# Patient Record
Sex: Female | Born: 1975 | Race: Black or African American | Hispanic: No | Marital: Single | Smoking: Current every day smoker
Health system: Southern US, Community
[De-identification: ages and names within clinical notes are randomized; demographics above are authoritative.]

## PROBLEM LIST (undated history)

## (undated) DIAGNOSIS — R3915 Urgency of urination: Secondary | ICD-10-CM

## (undated) DIAGNOSIS — N390 Urinary tract infection, site not specified: Secondary | ICD-10-CM

## (undated) DIAGNOSIS — N3010 Interstitial cystitis (chronic) without hematuria: Secondary | ICD-10-CM

## (undated) DIAGNOSIS — R3989 Other symptoms and signs involving the genitourinary system: Secondary | ICD-10-CM

## (undated) DIAGNOSIS — E049 Nontoxic goiter, unspecified: Secondary | ICD-10-CM

## (undated) DIAGNOSIS — R351 Nocturia: Secondary | ICD-10-CM

## (undated) HISTORY — DX: Nontoxic goiter, unspecified: E04.9

## (undated) HISTORY — DX: Other symptoms and signs involving the genitourinary system: R39.89

## (undated) HISTORY — PX: CYSTOSCOPY WITH HYDRODISTENSION AND BIOPSY: SHX5127

## (undated) HISTORY — DX: Interstitial cystitis (chronic) without hematuria: N30.10

## (undated) HISTORY — DX: Urinary tract infection, site not specified: N39.0

## (undated) HISTORY — DX: Urgency of urination: R39.15

## (undated) HISTORY — DX: Nocturia: R35.1

---

## 2012-10-22 ENCOUNTER — Emergency Department: Payer: Self-pay | Admitting: Emergency Medicine

## 2012-10-22 LAB — URINALYSIS, COMPLETE
Bilirubin,UR: NEGATIVE
Blood: NEGATIVE
Ketone: NEGATIVE
Nitrite: NEGATIVE
Ph: 5 (ref 4.5–8.0)
Protein: NEGATIVE

## 2012-10-27 ENCOUNTER — Emergency Department: Payer: Self-pay | Admitting: Emergency Medicine

## 2012-10-27 LAB — URINALYSIS, COMPLETE
Leukocyte Esterase: NEGATIVE
Nitrite: POSITIVE
Ph: 5 (ref 4.5–8.0)
Protein: NEGATIVE
RBC,UR: 1 /HPF (ref 0–5)
Specific Gravity: 1.026 (ref 1.003–1.030)
WBC UR: 3 /HPF (ref 0–5)

## 2013-02-12 DIAGNOSIS — R351 Nocturia: Secondary | ICD-10-CM

## 2013-02-12 DIAGNOSIS — N390 Urinary tract infection, site not specified: Secondary | ICD-10-CM

## 2013-02-12 DIAGNOSIS — R3915 Urgency of urination: Secondary | ICD-10-CM

## 2013-02-12 HISTORY — DX: Urgency of urination: R39.15

## 2013-02-12 HISTORY — DX: Nocturia: R35.1

## 2013-02-12 HISTORY — DX: Urinary tract infection, site not specified: N39.0

## 2013-05-01 ENCOUNTER — Ambulatory Visit: Payer: Self-pay | Admitting: Urology

## 2013-12-16 DIAGNOSIS — R3989 Other symptoms and signs involving the genitourinary system: Secondary | ICD-10-CM

## 2013-12-16 DIAGNOSIS — N3010 Interstitial cystitis (chronic) without hematuria: Secondary | ICD-10-CM

## 2013-12-16 DIAGNOSIS — R350 Frequency of micturition: Secondary | ICD-10-CM | POA: Insufficient documentation

## 2013-12-16 HISTORY — DX: Interstitial cystitis (chronic) without hematuria: N30.10

## 2013-12-16 HISTORY — DX: Other symptoms and signs involving the genitourinary system: R39.89

## 2014-01-14 ENCOUNTER — Emergency Department: Payer: Self-pay | Admitting: Emergency Medicine

## 2014-05-31 NOTE — Op Note (Signed)
PATIENT NAME:  Maria Rush, Maria Rush MR#:  [id number] DATE OF BIRTH:  09-29-1975  DATE OF PROCEDURE:  05/01/2013   PREOPERATIVE DIAGNOSIS: Pelvic pain, frequency, urgency.   POSTOPERATIVE DIAGNOSIS:  Pelvic pain, frequency, urgency.  PROCEDURE: Cystoscopy with hydrodistention.   SURGEON: Irineo AxonScott Stoioff, M.D.   ASSISTANT: None.   ANESTHESIA: General.   INDICATIONS: This is a 39 year old female with a remote history of interstitial cystitis. She has had recent worsening of frequency, urgency, urge incontinence, and bladder pressure/pain. She has been on Toviaz with mild to moderate improvement in her symptoms. She presents for cystoscopy with hydrodistention.   DESCRIPTION OF PROCEDURE: The patient was taken to the cystoscopy suite and placed on the table in the supine position. A general anesthetic was administered via an LMA, and she was then placed in the low lithotomy position. Her external genitalia were prepped and draped in the usual fashion. A timeout was performed with all in agreement.   A 21-French cystoscope sheath with obturator was lubricated and passed per urethra. The bladder was emptied and cystoscopy was performed, which showed normal-appearing bladder mucosa without erythema or solid papillary lesions. The ureteral orifices were normal in position with clear efflux bilaterally. Under 80 cm water pressure, the bladder was filled to equilibrium. The bladder was drained and only 350 mL of saline was obtained. Repeat cystoscopy was performed, which showed multiple glomerulations in all areas of the bladder. The bladder was then distended in a similar fashion and kept distended for 5 minutes. Upon refill, the capacity was 350 mL with the most terminal portion of the irrigant being blood tinged. Repeat cystoscopy again shows predominant glomerulations without ulceration. A B and O suppository was placed per rectum. She was taken to PACU in stable condition.   There were no complications.  EBL was minimal.        ____________________________ Verna CzechScott C. Lonna CobbStoioff, MD scs:tc D: 05/02/2013 19:22:34 ET T: 05/02/2013 20:37:38 ET JOB#: [id number]  cc: Lorin PicketScott C. Lonna CobbStoioff, MD, <Dictator>    Riki AltesSCOTT C STOIOFF MD ELECTRONICALLY SIGNED 05/15/2013 13:52

## 2014-11-28 DIAGNOSIS — E049 Nontoxic goiter, unspecified: Secondary | ICD-10-CM

## 2014-11-28 HISTORY — DX: Nontoxic goiter, unspecified: E04.9

## 2014-12-11 ENCOUNTER — Other Ambulatory Visit: Payer: Self-pay | Admitting: Family Medicine

## 2014-12-11 DIAGNOSIS — E049 Nontoxic goiter, unspecified: Secondary | ICD-10-CM

## 2014-12-16 ENCOUNTER — Other Ambulatory Visit: Payer: Self-pay

## 2014-12-19 ENCOUNTER — Ambulatory Visit
Admission: RE | Admit: 2014-12-19 | Discharge: 2014-12-19 | Disposition: A | Discharge status: Home / Self Care | Payer: Medicaid Other | Source: Ambulatory Visit | Attending: Family Medicine | Admitting: Family Medicine

## 2014-12-19 DIAGNOSIS — E049 Nontoxic goiter, unspecified: Secondary | ICD-10-CM | POA: Diagnosis not present

## 2015-11-14 ENCOUNTER — Encounter: Payer: Self-pay | Admitting: Emergency Medicine

## 2015-11-14 ENCOUNTER — Emergency Department
Admission: EM | Admit: 2015-11-14 | Discharge: 2015-11-14 | Disposition: A | Discharge status: Home / Self Care | Payer: Medicaid Other | Attending: Emergency Medicine | Admitting: Emergency Medicine

## 2015-11-14 DIAGNOSIS — N3000 Acute cystitis without hematuria: Secondary | ICD-10-CM

## 2015-11-14 HISTORY — DX: Interstitial cystitis (chronic) without hematuria: N30.10

## 2015-11-14 LAB — URINALYSIS COMPLETE WITH MICROSCOPIC (ARMC ONLY)
Bacteria, UA: NONE SEEN
Bilirubin Urine: NEGATIVE
GLUCOSE, UA: NEGATIVE mg/dL
KETONES UR: NEGATIVE mg/dL
NITRITE: NEGATIVE
PH: 5 (ref 5.0–8.0)
Protein, ur: NEGATIVE mg/dL
Specific Gravity, Urine: 1.019 (ref 1.005–1.030)

## 2015-11-14 LAB — GLUCOSE, CAPILLARY: Glucose-Capillary: 109 mg/dL — ABNORMAL HIGH (ref 65–99)

## 2015-11-14 MED ORDER — CIPROFLOXACIN HCL 500 MG PO TABS: 500.0000 mg | ORAL_TABLET | Freq: Two times a day (BID) | ORAL | 0 refills | Status: DC

## 2015-11-14 NOTE — ED Triage Notes (Addendum)
Pt has had increased frequency over last 1-2 weeks. Notes urine has foul odor as well. Denies any abdominal or back pain. Frequent UTI r/t hx but never has had to be admitted.

## 2015-11-14 NOTE — ED Provider Notes (Signed)
[facility] Emergency Department Provider Note  ____________________________________________  Time seen: Approximately 1:15 PM  I have reviewed the triage vital signs and the nursing notes.   HISTORY  Chief Complaint Urinary Frequency    HPI Maria Rush is a 40 y.o. female who presents to the emergency department for evaluation of urinary frequency. She states that these symptoms are very similar to her previous urinary tract infections. She states that her urine has a file odor and she is having to urinate about every hour. Symptoms have progressively worsened over the past 1-2 weeks. She has not taken any medications. She denies back pain, nausea, or vomiting. She states that typically these symptoms occur and after taking Cipro for a couple of days symptoms resolved.  Past Medical History:  Diagnosis Date  . Interstitial cystitis     There are no active problems to display for this patient.   Past Surgical History:  Procedure Laterality Date  . CYSTOSCOPY WITH HYDRODISTENSION AND BIOPSY      Prior to Admission medications   Medication Sig Start Date End Date Taking? Authorizing Provider  ciprofloxacin (CIPRO) 500 MG tablet Take 1 tablet (500 mg total) by mouth 2 (two) times daily. 11/14/15   Chinita Pester, FNP    Allergies Iodinated diagnostic agents and Sulfa antibiotics  History reviewed. No pertinent family history.  Social History Social History  Substance Use Topics  . Smoking status: Never Smoker  . Smokeless tobacco: Never Used  . Alcohol use No    Review of Systems Constitutional: Negative for fever. Respiratory: Negative for shortness of breath or cough. Gastrointestinal: Negative for abdominal pain; negative for nausea , negative for vomiting. Genitourinary: Negative for dysuria , negative for vaginal discharge. Positive for urinary frequency and malodorous urine Musculoskeletal: Negative for back pain. Skin: Negative  for rash, will call or lesion.. ____________________________________________   PHYSICAL EXAM:  VITAL SIGNS: ED Triage Vitals  Enc Vitals Group     BP 11/14/15 1146 121/78     Pulse Rate 11/14/15 1145 100     Resp 11/14/15 1145 18     Temp 11/14/15 1145 98.2 F (36.8 C)     Temp Source 11/14/15 1145 Oral     SpO2 11/14/15 1145 99 %     Weight --      Height 11/14/15 1146 5' (1.524 m)     Head Circumference --      Peak Flow --      Pain Score 11/14/15 1206 6     Pain Loc --      Pain Edu? --      Excl. in GC? --     Constitutional: Alert and oriented. Well appearing and in no acute distress. Eyes: Conjunctivae are normal. EOMI. Head: Atraumatic. Nose: No congestion/rhinnorhea. Mouth/Throat: Mucous membranes are moist. Respiratory: Normal respiratory effort.  No retractions. Gastrointestinal: Abdomen soft, nontender, no rebound or guarding. Genitourinary: Pelvic exam: Not indicated Musculoskeletal: No extremity tenderness nor edema.  Neurologic:  Normal speech and language. No gross focal neurologic deficits are appreciated. Speech is normal. No gait instability. Skin:  Skin is warm, dry and intact. No rash noted. Psychiatric: Mood and affect are normal. Speech and behavior are normal.  ____________________________________________   LABS (all labs ordered are listed, but only abnormal results are displayed)  Labs Reviewed  URINALYSIS COMPLETEWITH MICROSCOPIC ([facility] ONLY) - Abnormal; Notable for the following:       Result Value   Color, Urine YELLOW (*)  APPearance CLEAR (*)    Hgb urine dipstick 1+ (*)    Leukocytes, UA 1+ (*)    Squamous Epithelial / LPF 0-5 (*)    All other components within normal limits  GLUCOSE, CAPILLARY - Abnormal; Notable for the following:    Glucose-Capillary 109 (*)    All other components within normal limits  CBG MONITORING, ED  POC URINE PREG, ED   ____________________________________________  RADIOLOGY  Not  indicated ____________________________________________   PROCEDURES  Procedure(s) performed: None  ____________________________________________   INITIAL IMPRESSION / ASSESSMENT AND PLAN / ED COURSE  Clinical Course     Pertinent labs & imaging results that were available during my care of the patient were reviewed by me and considered in my medical decision making (see chart for details).   Patient will be given prescription for Cipro today. She was advised to follow up with her primary care provider for symptoms that are not improving over the week. She was advised to return to the ER for symptoms that change or worsen if unable to schedule an appointment. ____________________________________________   FINAL CLINICAL IMPRESSION(S) / ED DIAGNOSES  Final diagnoses:  Acute cystitis without hematuria    Note:  This document was prepared using Dragon voice recognition software and may include unintentional dictation errors.    Chinita PesterCari B Ethyle Tiedt, FNP 11/14/15 1319    Governor Rooksebecca Lord, MD 11/14/15 [phone]

## 2015-11-14 NOTE — ED Notes (Signed)
Pt stating "I know I have a UTI." Pt stating that she has had several UTIs in the past so she is able to tell when she gets a UTI. Pt stating she has odor and frequency with urination.

## 2015-11-16 LAB — POCT PREGNANCY, URINE: Preg Test, Ur: NEGATIVE

## 2016-05-27 DIAGNOSIS — R399 Unspecified symptoms and signs involving the genitourinary system: Secondary | ICD-10-CM | POA: Insufficient documentation

## 2016-07-08 ENCOUNTER — Ambulatory Visit: Payer: Self-pay

## 2016-07-15 ENCOUNTER — Ambulatory Visit: Payer: Self-pay | Admitting: Urology

## 2016-07-15 ENCOUNTER — Telehealth: Payer: Self-pay | Admitting: Urology

## 2016-07-15 NOTE — Telephone Encounter (Signed)
patient was almost 30 minutes late for her last app on 07-15-16 it was Friday afternoon and she was our last patient. Dr. Sherryl BartersBudzyn left for the day and we were getting ready to leave and the patient showed up. she was upset because there was no one here to see her. I offered her an app for 07-18-16 but she said she could not get off of work. So I rescheduled her app. I apologized and she just asked  Why we didn't call  her to see where she was.?   Marcelino DusterMichelle

## 2016-08-23 ENCOUNTER — Encounter: Payer: Self-pay | Admitting: Urology

## 2016-08-23 ENCOUNTER — Ambulatory Visit (INDEPENDENT_AMBULATORY_CARE_PROVIDER_SITE_OTHER): Payer: Managed Care, Other (non HMO) | Admitting: Urology

## 2016-08-23 VITALS — BP 113/75 | HR 105 | Ht 60.0 in | Wt 161.0 lb

## 2016-08-23 DIAGNOSIS — N3010 Interstitial cystitis (chronic) without hematuria: Secondary | ICD-10-CM | POA: Insufficient documentation

## 2016-08-23 DIAGNOSIS — R350 Frequency of micturition: Secondary | ICD-10-CM | POA: Diagnosis not present

## 2016-08-23 LAB — URINALYSIS, COMPLETE
Bilirubin, UA: NEGATIVE
Glucose, UA: NEGATIVE
NITRITE UA: NEGATIVE
PH UA: 5.5 (ref 5.0–7.5)
Specific Gravity, UA: >1.03 — ABNORMAL HIGH (ref 1.005–1.030)
UUROB: 0.2 mg/dL (ref 0.2–1.0)

## 2016-08-23 LAB — MICROSCOPIC EXAMINATION: RBC MICROSCOPIC, UA: NONE SEEN /HPF (ref 0–?)

## 2016-08-23 MED ORDER — AMITRIPTYLINE HCL 25 MG PO TABS: 25.0000 mg | ORAL_TABLET | Freq: Every day | ORAL | 3 refills | Status: AC

## 2016-08-23 NOTE — Progress Notes (Signed)
urin

## 2016-08-23 NOTE — Progress Notes (Signed)
08/23/2016 11:44 AM   Westley ChandlerKimberly L Rush 1975/03/26 [id number]  Referring provider: Rayetta HumphreyGeorge, Sionne A, MD [address] [place] [address] [place], [place] [id number]  No chief complaint on file.   HPI: Consultation for lower urinary tract symptoms. She has symptoms of frequency, urgency and bladder pressure. She has a long history of interstitial cystitis. She notes that Dr. Garner Nashaniels diagnosed [place] in [place] approximately 20 years ago and she underwent hydraulic distention at that time and treatment with DMSO and she notes she did fairly well with this treatment stress in her family right now. She was laid off work from [employer] [place] and her daughter has gotten into some trouble and she is in very stressed out. She was subsequently seen by Dr. Virl DiamondStoiff in [place] and she underwent a second hydraulic bladder distention in 04/11/2013 with a capacity of 350 mL. She didn't think it helped her symptoms. She notes that she has tried some Atarax that has helped some. She has tried MidwifeToviaz, Personal assistantVESIcare and Myrbetriq and it helped slightly. She saw Dr. Earlene Plateravis and he started amitriptyline and Elmiron. She remains on amitriptyline 10 mg. Her main problem is she has severe urgency, frequency, nocturia q.3h over past few weeks. Stress really affects her bladder.  No dysuria or gross hematuria. Her UA today shows 11-30 wbc and moderate bacteria. I did send for urine cx.   Modifying factors: There are no other modifying factors  Associated signs and symptoms: There are no other associated signs and symptoms Aggravating and relieving factors: There are no other aggravating or relieving factors Severity: Moderate Duration: Persistent   PMH: Past Medical History:  Diagnosis Date  . Goiter 11/28/2014  . Interstitial cystitis     Surgical History: Past Surgical History:  Procedure Laterality Date  . CYSTOSCOPY WITH HYDRODISTENSION AND BIOPSY      Home Medications:  Allergies as of 08/23/2016      Reactions   Iodinated  Diagnostic Agents Swelling   Sulfa Antibiotics Rash      Medication List       Accurate as of 08/23/16 11:44 AM. Always use your most recent med list.          amitriptyline 10 MG tablet Commonly known as:  ELAVIL TAKE 1 TABLET (10 MG TOTAL) BY MOUTH NIGHTLY.       Allergies:  Allergies  Allergen Reactions  . Iodinated Diagnostic Agents Swelling  . Sulfa Antibiotics Rash    Family History: No family history on file.  Social History:  reports that she has never smoked. She has never used smokeless tobacco. She reports that she does not drink alcohol or use drugs.  ROS:                                        Physical Exam: BP 113/75   Pulse (!) 105   Ht 5' (1.524 m)   Wt 73 kg (161 lb)   LMP 08/01/2016 (Approximate)   BMI 31.44 kg/m   Constitutional:  Alert and oriented, No acute distress. HEENT: [place] AT, moist mucus membranes.  Trachea midline, no masses. Cardiovascular: No clubbing, cyanosis, or edema. Respiratory: Normal respiratory effort, no increased work of breathing. GI: Abdomen is soft, nontender, nondistended, no abdominal masses GU: No CVA tenderness. Skin: No rashes, bruises or suspicious lesions. Neurologic: Grossly intact, no focal deficits, moving all 4 extremities. Psychiatric: Normal mood and affect.  Laboratory Data:  No results found for: WBC, HGB, HCT, MCV, PLT  No results found for: CREATININE  No results found for: PSA  No results found for: TESTOSTERONE  No results found for: HGBA1C  Urinalysis    Component Value Date/Time   COLORURINE YELLOW (A) 11/14/2015 1157   APPEARANCEUR CLEAR (A) 11/14/2015 1157   APPEARANCEUR Clear 10/27/2012 1109   LABSPEC 1.019 11/14/2015 1157   LABSPEC 1.026 10/27/2012 1109   PHURINE 5.0 11/14/2015 1157   GLUCOSEU NEGATIVE 11/14/2015 1157   GLUCOSEU Negative 10/27/2012 1109   HGBUR 1+ (A) 11/14/2015 1157   BILIRUBINUR NEGATIVE 11/14/2015 1157   BILIRUBINUR Negative  10/27/2012 1109   KETONESUR NEGATIVE 11/14/2015 1157   PROTEINUR NEGATIVE 11/14/2015 1157   NITRITE NEGATIVE 11/14/2015 1157   LEUKOCYTESUR 1+ (A) 11/14/2015 1157   LEUKOCYTESUR Negative 10/27/2012 1109     Assessment & Plan:    1) frequency, urgency-we discussed dietary irritants. We discussed the role of PT/OT of the pelvic floor. We discussed trial of other anticholinergics as well as Myrbetriq. Other alternatives include increase amitriptyline or add hydroxyzine. Urine was sent for Cx. We'll increase amitriptyline to 25 mg po qhs and then can decrease when things settle down.    There are no diagnoses linked to this encounter.  No Follow-up on file.  Jerilee Field, MD  [facility] [address] [place], [place] [address] [phone]

## 2016-08-24 ENCOUNTER — Other Ambulatory Visit: Payer: Self-pay | Admitting: Otolaryngology

## 2016-08-24 ENCOUNTER — Ambulatory Visit: Payer: Managed Care, Other (non HMO)

## 2016-08-24 DIAGNOSIS — E041 Nontoxic single thyroid nodule: Secondary | ICD-10-CM

## 2016-08-27 LAB — CULTURE, URINE COMPREHENSIVE

## 2016-11-22 NOTE — Progress Notes (Deleted)
11/24/2016 9:08 PM   Maria Rush 01/31/1976 [id number]  Referring provider: Rayetta Humphrey, MD [address] [place], [place] [address]  No chief complaint on file.   HPI: 41 yo AAF with IC who presents for a 3 month follow up.  Background history Consultation for lower urinary tract symptoms. She has symptoms of frequency, urgency and bladder pressure. She has a long history of interstitial cystitis. She notes that Dr. Garner Nash diagnosed her in [place] approximately 20 years ago and she underwent hydraulic distention at that time and treatment with DMSO and she notes she did fairly well with this treatment stress in her family right now. She was laid off work from [employer] [place] and her daughter has gotten into some trouble and she is in very stressed out. She was subsequently seen by Dr. Virl Diamond in [place] and she underwent a second hydraulic bladder distention in 04/11/2013 with a capacity of 350 mL. She didn't think it helped her symptoms. She notes that she has tried some Atarax that has helped some. She has tried Midwife, Personal assistant and it helped slightly. She saw Dr. Earlene Plater and he started amitriptyline and Elmiron. She remains on amitriptyline 10 mg. Her main problem is she has severe urgency, frequency, nocturia q.3h over past few weeks. Stress really affects her bladder.  No dysuria or gross hematuria. Her UA today shows 11-30 wbc and moderate bacteria. I did send for urine cx.   Modifying factors: There are no other modifying factors  Associated signs and symptoms: There are no other associated signs and symptoms Aggravating and relieving factors: There are no other aggravating or relieving factors Severity: Moderate Duration: Persistent  Today, she has been experiencing urgency x *** (***), frequency x *** (***), not/is restricting fluids to avoid visits to the restroom ***, not/is engaging in toilet mapping, incontinence x *** (***) and nocturia x *** (***).   Her PVR is ***.     PMH: Past Medical History:  Diagnosis Date  . Bladder pain 12/16/2013  . Chronic interstitial cystitis 12/16/2013  . Goiter 11/28/2014  . Interstitial cystitis   . Nocturia 02/12/2013  . Recurrent UTI 02/12/2013  . Urinary urgency 02/12/2013    Surgical History: Past Surgical History:  Procedure Laterality Date  . CYSTOSCOPY WITH HYDRODISTENSION AND BIOPSY      Home Medications:  Allergies as of 11/24/2016      Reactions   Iodinated Diagnostic Agents Swelling   Sulfa Antibiotics Rash      Medication List       Accurate as of 11/22/16  9:08 PM. Always use your most recent med list.          amitriptyline 25 MG tablet Commonly known as:  ELAVIL Take 1 tablet (25 mg total) by mouth at bedtime.       Allergies:  Allergies  Allergen Reactions  . Iodinated Diagnostic Agents Swelling  . Sulfa Antibiotics Rash    Family History: No family history on file.  Social History:  reports that she has never smoked. She has never used smokeless tobacco. She reports that she does not drink alcohol or use drugs.  ROS:                                        Physical Exam: There were no vitals taken for this visit.  Constitutional:  Alert and oriented, No acute  distress. HEENT: [place] AT, moist mucus membranes.  Trachea midline, no masses. Cardiovascular: No clubbing, cyanosis, or edema. Respiratory: Normal respiratory effort, no increased work of breathing. GI: Abdomen is soft, nontender, nondistended, no abdominal masses GU: No CVA tenderness. Skin: No rashes, bruises or suspicious lesions. Neurologic: Grossly intact, no focal deficits, moving all 4 extremities. Psychiatric: Normal mood and affect.  Laboratory Data:  Urinalysis ***   Assessment & Plan:    1) frequency, urgency-we discussed dietary irritants. We discussed the role of PT/OT of the pelvic floor. We discussed trial of other anticholinergics as well as Myrbetriq.  Other alternatives include increase amitriptyline or add hydroxyzine. Urine was sent for Cx. We'll increase amitriptyline to 25 mg po qhs and then can decrease when things settle down.      No Follow-up on file.  Michiel Cowboy, PA-C  [facility] [address] [place], [place] [address] [phone]

## 2016-11-24 ENCOUNTER — Ambulatory Visit: Payer: Managed Care, Other (non HMO) | Admitting: Urology

## 2017-08-25 ENCOUNTER — Ambulatory Visit: Payer: Medicaid Other

## 2017-08-29 ENCOUNTER — Other Ambulatory Visit: Payer: Self-pay | Admitting: Otolaryngology

## 2017-08-29 DIAGNOSIS — E041 Nontoxic single thyroid nodule: Secondary | ICD-10-CM

## 2018-08-30 ENCOUNTER — Ambulatory Visit: Payer: Self-pay

## 2018-09-20 ENCOUNTER — Ambulatory Visit: Payer: Self-pay

## 2018-09-25 ENCOUNTER — Other Ambulatory Visit: Payer: Self-pay | Admitting: Otolaryngology

## 2018-09-25 DIAGNOSIS — E041 Nontoxic single thyroid nodule: Secondary | ICD-10-CM

## 2019-01-01 ENCOUNTER — Emergency Department
Admission: EM | Admit: 2019-01-01 | Discharge: 2019-01-01 | Disposition: A | Discharge status: Home / Self Care | Payer: Managed Care, Other (non HMO) | Attending: Emergency Medicine | Admitting: Emergency Medicine

## 2019-01-01 ENCOUNTER — Emergency Department: Payer: Managed Care, Other (non HMO)

## 2019-01-01 ENCOUNTER — Other Ambulatory Visit: Payer: Self-pay

## 2019-01-01 ENCOUNTER — Encounter: Payer: Self-pay | Admitting: *Deleted

## 2019-01-01 DIAGNOSIS — Z882 Allergy status to sulfonamides status: Secondary | ICD-10-CM | POA: Insufficient documentation

## 2019-01-01 DIAGNOSIS — Y999 Unspecified external cause status: Secondary | ICD-10-CM | POA: Insufficient documentation

## 2019-01-01 DIAGNOSIS — Z91041 Radiographic dye allergy status: Secondary | ICD-10-CM | POA: Insufficient documentation

## 2019-01-01 DIAGNOSIS — Y92411 Interstate highway as the place of occurrence of the external cause: Secondary | ICD-10-CM | POA: Diagnosis not present

## 2019-01-01 DIAGNOSIS — Z79899 Other long term (current) drug therapy: Secondary | ICD-10-CM | POA: Diagnosis not present

## 2019-01-01 DIAGNOSIS — Y9389 Activity, other specified: Secondary | ICD-10-CM | POA: Diagnosis not present

## 2019-01-01 DIAGNOSIS — S40022A Contusion of left upper arm, initial encounter: Secondary | ICD-10-CM | POA: Diagnosis not present

## 2019-01-01 DIAGNOSIS — S39012A Strain of muscle, fascia and tendon of lower back, initial encounter: Secondary | ICD-10-CM | POA: Diagnosis not present

## 2019-01-01 DIAGNOSIS — S40021A Contusion of right upper arm, initial encounter: Secondary | ICD-10-CM | POA: Insufficient documentation

## 2019-01-01 DIAGNOSIS — S4991XA Unspecified injury of right shoulder and upper arm, initial encounter: Secondary | ICD-10-CM | POA: Diagnosis present

## 2019-01-01 MED ORDER — MELOXICAM 7.5 MG PO TABS
15.0000 mg | ORAL_TABLET | Freq: Once | ORAL | Status: AC
  Administered 2019-01-01: 15 mg via ORAL
  Filled 2019-01-01: qty 2

## 2019-01-01 MED ORDER — METHOCARBAMOL 500 MG PO TABS: 500.0000 mg | ORAL_TABLET | Freq: Four times a day (QID) | ORAL | 0 refills | Status: DC

## 2019-01-01 MED ORDER — METHOCARBAMOL 500 MG PO TABS
1000.0000 mg | ORAL_TABLET | Freq: Once | ORAL | Status: AC
  Administered 2019-01-01: 1000 mg via ORAL
  Filled 2019-01-01: qty 2

## 2019-01-01 MED ORDER — MELOXICAM 15 MG PO TABS: 15.0000 mg | ORAL_TABLET | Freq: Every day | ORAL | 0 refills | Status: DC

## 2019-01-01 MED ORDER — LOPERAMIDE HCL 2 MG PO TABS: 2.0000 mg | ORAL_TABLET | Freq: Four times a day (QID) | ORAL | 0 refills | Status: DC | PRN

## 2019-01-01 MED ORDER — HYDROCODONE-ACETAMINOPHEN 5-325 MG PO TABS
1.0000 | ORAL_TABLET | Freq: Once | ORAL | Status: AC
  Administered 2019-01-01: 23:00:00 1 via ORAL
  Filled 2019-01-01: qty 1

## 2019-01-01 NOTE — ED Notes (Signed)
PT c/o back and bilateral arm pain. PT denies hitting head at all. Mostly arm pain.

## 2019-01-01 NOTE — ED Notes (Signed)
Pt signed physical discharge form. 
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
## 2019-02-04 ENCOUNTER — Emergency Department
Admission: EM | Admit: 2019-02-04 | Discharge: 2019-02-04 | Discharge status: Absent without leave | Payer: Managed Care, Other (non HMO)

## 2019-09-25 ENCOUNTER — Ambulatory Visit: Admission: RE | Admit: 2019-09-25 | Payer: Managed Care, Other (non HMO) | Source: Ambulatory Visit

## 2019-11-24 ENCOUNTER — Emergency Department: Payer: Managed Care, Other (non HMO)

## 2019-11-24 ENCOUNTER — Other Ambulatory Visit: Payer: Self-pay

## 2019-11-24 ENCOUNTER — Emergency Department
Admission: EM | Admit: 2019-11-24 | Discharge: 2019-11-24 | Disposition: A | Discharge status: Home / Self Care | Payer: Managed Care, Other (non HMO) | Attending: Emergency Medicine | Admitting: Emergency Medicine

## 2019-11-24 DIAGNOSIS — K5900 Constipation, unspecified: Secondary | ICD-10-CM | POA: Diagnosis not present

## 2019-11-24 MED ORDER — SORBITOL 70 % SOLN
960.0000 mL | TOPICAL_OIL | ORAL | Status: AC
  Administered 2019-11-24: 960 mL via RECTAL
  Filled 2019-11-24: qty 473

## 2019-11-24 MED ORDER — SORBITOL 70 % SOLN
960.0000 mL | TOPICAL_OIL | Freq: Once | ORAL | Status: DC
  Filled 2019-11-24: qty 473

## 2019-11-24 MED ORDER — DIBUCAINE (PERIANAL) 1 % EX OINT
TOPICAL_OINTMENT | Freq: Once | CUTANEOUS | Status: AC
  Filled 2019-11-24: qty 28

## 2019-11-24 NOTE — ED Triage Notes (Signed)
Pt comes POV with constipation since Tuesday. Pt had a small BM Tuesday but last normal BM was 1.5 weeks ago. Abdominal distension. Pt had a suppository this morning with no relief.

## 2019-11-24 NOTE — ED Provider Notes (Signed)
[facility] Emergency Department Provider Note  ____________________________________________   First MD Initiated Contact with Patient 11/24/19 1632     (approximate)  I have reviewed the triage vital signs and the nursing notes.   HISTORY  Chief Complaint Constipation    HPI Maria Rush is a 44 y.o. female presents emergency department complaining of constipation for 5 days.  Patient states she had a small BM on Tuesday but her last normal bowel movement was 1-1/2 weeks ago.  States she feels bloated and having pressure making her urinate more.  She tried any over-the-counter suppository earlier today without any success.  She has not tried any laxatives or stool softeners.    Past Medical History:  Diagnosis Date  . Bladder pain 12/16/2013  . Chronic interstitial cystitis 12/16/2013  . Goiter 11/28/2014  . Interstitial cystitis   . Nocturia 02/12/2013  . Recurrent UTI 02/12/2013  . Urinary urgency 02/12/2013    Patient Active Problem List   Diagnosis Date Noted  . Interstitial cystitis 08/23/2016  . UTI symptoms 05/27/2016  . Goiter 11/28/2014  . Bladder pain 12/16/2013  . Chronic interstitial cystitis 12/16/2013  . Increased frequency of urination 12/16/2013  . Nocturia 02/12/2013  . Recurrent UTI 02/12/2013  . Urinary urgency 02/12/2013    Past Surgical History:  Procedure Laterality Date  . CYSTOSCOPY WITH HYDRODISTENSION AND BIOPSY      Prior to Admission medications   Medication Sig Start Date End Date Taking? Authorizing Provider  amitriptyline (ELAVIL) 25 MG tablet Take 1 tablet (25 mg total) by mouth at bedtime. 08/23/16   Jerilee Field, MD  loperamide (IMODIUM A-D) 2 MG tablet Take 1 tablet (2 mg total) by mouth 4 (four) times daily as needed for diarrhea or loose stools. 01/01/19   Cuthriell, Delorise Royals, PA-C  meloxicam (MOBIC) 15 MG tablet Take 1 tablet (15 mg total) by mouth daily. 01/01/19   Cuthriell, Delorise Royals, PA-C    methocarbamol (ROBAXIN) 500 MG tablet Take 1 tablet (500 mg total) by mouth 4 (four) times daily. 01/01/19   Cuthriell, Delorise Royals, PA-C    Allergies Iodinated diagnostic agents and Sulfa antibiotics  History reviewed. No pertinent family history.  Social History Social History   Tobacco Use  . Smoking status: Never Smoker  . Smokeless tobacco: Never Used  Substance Use Topics  . Alcohol use: No  . Drug use: No    Review of Systems  Constitutional: No fever/chills Eyes: No visual changes. ENT: No sore throat. Respiratory: Denies cough Cardiovascular: Denies chest pain Gastrointestinal: Denies abdominal pain, positive constipation Genitourinary: Negative for dysuria. Musculoskeletal: Negative for back pain. Skin: Negative for rash. Psychiatric: no mood changes,     ____________________________________________   PHYSICAL EXAM:  VITAL SIGNS: ED Triage Vitals [11/24/19 1544]  Enc Vitals Group     BP (!) 162/93     Pulse Rate (!) 110     Resp 18     Temp 99.1 F (37.3 C)     Temp Source Oral     SpO2 99 %     Weight      Height      Head Circumference      Peak Flow      Pain Score 4     Pain Loc      Pain Edu?      Excl. in GC?     Constitutional: Alert and oriented. Well appearing and in no acute distress. Eyes: Conjunctivae are normal.  Head: Atraumatic. Nose: No congestion/rhinnorhea. Mouth/Throat: Mucous membranes are moist.   Neck:  supple no lymphadenopathy noted Cardiovascular: Normal rate, regular rhythm. Respiratory: Normal respiratory effort.  No retractions,  Abd: soft nontender bs normal all 4 quad GU: deferred Musculoskeletal: FROM all extremities, warm and well perfused Neurologic:  Normal speech and language.  Skin:  Skin is warm, dry and intact. No rash noted. Psychiatric: Mood and affect are normal. Speech and behavior are normal.  ____________________________________________   LABS (all labs ordered are listed, but only  abnormal results are displayed)  Labs Reviewed  POC URINE PREG, ED   ____________________________________________   ____________________________________________  RADIOLOGY  Abdomen 1 view shows a large stool ball, no obstruction Repeat abdomen one view shows decreasing stool burden ____________________________________________   PROCEDURES  Procedure(s) performed: Smog enema  Procedures    ____________________________________________   INITIAL IMPRESSION / ASSESSMENT AND PLAN / ED COURSE  Pertinent labs & imaging results that were available during my care of the patient were reviewed by me and considered in my medical decision making (see chart for details).   Patient is a 44 year old female presents with constipation.  Physical exam shows patient to appear little uncomfortable.  Abdomen is nontender. X-ray does show a large stool ball.  Plan of care at this time is to try a smog enema.  If the patient is able to pass some stool she will be able to be discharged home with additional laxatives and stool softeners.  We had a long discussion about using MiraLAX daily as this would help keep the stool soft and she would not be constipated in this manner.   Smog enema was successful.  Patient states she has a lot of abdominal cramping at this time.  Repeat abdomen one view does show decreased stool burden.  She was discharged in stable condition with instructions to follow-up with her regular doctor as needed.  Use over-the-counter MiraLAX daily.  Return if worsening.  ALAUNA Rush was evaluated in Emergency Department on 11/24/2019 for the symptoms described in the history of present illness. She was evaluated in the context of the global COVID-19 pandemic, which necessitated consideration that the patient might be at risk for infection with the SARS-CoV-2 virus that causes COVID-19. Institutional protocols and algorithms that pertain to the evaluation of patients at risk for  COVID-19 are in a state of rapid change based on information released by regulatory bodies including the CDC and federal and state organizations. These policies and algorithms were followed during the patient's care in the ED.    As part of my medical decision making, I reviewed the following data within the electronic MEDICAL RECORD NUMBER Nursing notes reviewed and incorporated, Old chart reviewed, Radiograph reviewed , Notes from prior ED visits and [place] Controlled Substance Database  ____________________________________________   FINAL CLINICAL IMPRESSION(S) / ED DIAGNOSES  Final diagnoses:  Acute constipation      NEW MEDICATIONS STARTED DURING THIS VISIT:  New Prescriptions   No medications on file     Note:  This document was prepared using Dragon voice recognition software and may include unintentional dictation errors.    Faythe Ghee, PA-C 11/24/19 2153    Dionne Bucy, MD 11/24/19 2352

## 2019-11-24 NOTE — ED Notes (Signed)
Pt states she had a decent bowel movement, just having abdominal cramping. Provider notified.

## 2019-11-24 NOTE — Discharge Instructions (Addendum)
Take over-the-counter MiraLAX daily, 1 tablespoon in a clear liquid per day.  This will keep the stool soft so you will not become constipated Return to emergency department if worsening

## 2019-12-14 ENCOUNTER — Encounter: Payer: Self-pay | Admitting: Emergency Medicine

## 2019-12-14 ENCOUNTER — Emergency Department: Payer: Commercial Managed Care - PPO

## 2019-12-14 ENCOUNTER — Other Ambulatory Visit: Payer: Self-pay

## 2019-12-14 ENCOUNTER — Emergency Department
Admission: EM | Admit: 2019-12-14 | Discharge: 2019-12-14 | Disposition: A | Discharge status: Home / Self Care | Payer: Commercial Managed Care - PPO | Attending: Emergency Medicine | Admitting: Emergency Medicine

## 2019-12-14 DIAGNOSIS — B372 Candidiasis of skin and nail: Secondary | ICD-10-CM | POA: Insufficient documentation

## 2019-12-14 DIAGNOSIS — B9689 Other specified bacterial agents as the cause of diseases classified elsewhere: Secondary | ICD-10-CM | POA: Diagnosis not present

## 2019-12-14 DIAGNOSIS — K5900 Constipation, unspecified: Secondary | ICD-10-CM | POA: Diagnosis not present

## 2019-12-14 LAB — POC URINE PREG, ED: Preg Test, Ur: NEGATIVE

## 2019-12-14 MED ORDER — DOCUSATE SODIUM 50 MG/5ML PO LIQD
100.0000 mg | Freq: Once | ORAL | Status: AC
  Administered 2019-12-14: 100 mg via ORAL
  Filled 2019-12-14: qty 10

## 2019-12-14 MED ORDER — DIBUCAINE (PERIANAL) 1 % EX OINT
TOPICAL_OINTMENT | Freq: Once | CUTANEOUS | Status: AC
  Filled 2019-12-14: qty 28

## 2019-12-14 MED ORDER — GLYCERIN (LAXATIVE) 2.1 G RE SUPP
1.0000 | Freq: Once | RECTAL | Status: AC
  Administered 2019-12-14: 1 via RECTAL
  Filled 2019-12-14: qty 1

## 2019-12-14 MED ORDER — CLOTRIMAZOLE 1 % EX CREA: 1.0000 "application " | TOPICAL_CREAM | Freq: Two times a day (BID) | CUTANEOUS | 0 refills | Status: AC

## 2019-12-14 MED ORDER — MAGNESIUM CITRATE PO SOLN
1.0000 | Freq: Once | ORAL | Status: AC
  Administered 2019-12-14: 1 via ORAL
  Filled 2019-12-14: qty 296

## 2019-12-14 NOTE — ED Notes (Signed)
Pharmacy called to send meds.

## 2019-12-14 NOTE — ED Triage Notes (Signed)
Pt reports is constipated. Pt states has been seen here before for the same. Last moved bowels a little bit on Monday.

## 2019-12-14 NOTE — Discharge Instructions (Addendum)
Follow-up with your primary care provider if any continued problems or concerns.  Also a cream was sent to your pharmacy to begin using under your right breast.  Allow this area to get plenty of air whenever possible to help dry up the rash.  If not improving you will need to follow-up with your primary care provider.  Also read information about constipation.  Increase fiber in your diet and continue with MiraLAX 1 capful each morning with any beverage of your choice followed by 8 ounces of water.  Also continue with 6 to 8 glasses of water during the day with this.

## 2019-12-14 NOTE — ED Notes (Signed)
Commode in room, pt states she has not had bowel movement from meds already given. Pt states small amount of liquid stool when she wipes

## 2019-12-14 NOTE — ED Provider Notes (Signed)
[facility] Emergency Department Provider Note   ____________________________________________   First MD Initiated Contact with Patient 12/14/19 1041     (approximate)  I have reviewed the triage vital signs and the nursing notes.   HISTORY  Chief Complaint Constipation   HPI Maria Rush is a 44 y.o. female presents to the ED with complaint of constipation.  Patient was seen on 11/24/2019 for constipation at which time she was given an enema and discharged.  She was seen again on 12/10/19 for complaint of constipation at her primary care provider who advised her to begin using MiraLAX and Dulcolax tablets.  Patient states that she is taking a tablespoon of MiraLAX daily without any relief of her constipation.  She denies any fever, chills, vomiting.  Patient has had gas a couple times.  Patient has not tried any enemas or suppositories at home.  Rates her pain as 5 out of 10.      Past Medical History:  Diagnosis Date  . Bladder pain 12/16/2013  . Chronic interstitial cystitis 12/16/2013  . Goiter 11/28/2014  . Interstitial cystitis   . Nocturia 02/12/2013  . Recurrent UTI 02/12/2013  . Urinary urgency 02/12/2013    Patient Active Problem List   Diagnosis Date Noted  . Interstitial cystitis 08/23/2016  . UTI symptoms 05/27/2016  . Goiter 11/28/2014  . Bladder pain 12/16/2013  . Chronic interstitial cystitis 12/16/2013  . Increased frequency of urination 12/16/2013  . Nocturia 02/12/2013  . Recurrent UTI 02/12/2013  . Urinary urgency 02/12/2013    Past Surgical History:  Procedure Laterality Date  . CYSTOSCOPY WITH HYDRODISTENSION AND BIOPSY      Prior to Admission medications   Medication Sig Start Date End Date Taking? Authorizing Provider  amitriptyline (ELAVIL) 25 MG tablet Take 1 tablet (25 mg total) by mouth at bedtime. 08/23/16   Jerilee Field, MD  clotrimazole (LOTRIMIN) 1 % cream Apply 1 application topically 2 (two) times daily.  12/14/19   Tommi Rumps, PA-C    Allergies Iodinated diagnostic agents, Silicone, and Sulfa antibiotics  No family history on file.  Social History Social History   Tobacco Use  . Smoking status: Never Smoker  . Smokeless tobacco: Never Used  Substance Use Topics  . Alcohol use: No  . Drug use: No    Review of Systems Constitutional: No fever/chills Eyes: No visual changes. Cardiovascular: Denies chest pain. Respiratory: Denies shortness of breath. Gastrointestinal: No abdominal pain.  No nausea, no vomiting.  No diarrhea.  Positive constipation. Genitourinary: Negative for dysuria. Musculoskeletal: Negative for back pain. Skin: Positive for rash. Neurological: Negative for headaches, focal weakness or numbness. ____________________________________________   PHYSICAL EXAM:  VITAL SIGNS: ED Triage Vitals  Enc Vitals Group     BP 12/14/19 1009 140/88     Pulse Rate 12/14/19 1009 82     Resp 12/14/19 1009 20     Temp 12/14/19 1009 99.1 F (37.3 C)     Temp Source 12/14/19 1009 Oral     SpO2 12/14/19 1009 100 %     Weight 12/14/19 1007 167 lb (75.8 kg)     Height 12/14/19 1007 4\' 11"  (1.499 m)     Head Circumference --      Peak Flow --      Pain Score 12/14/19 1006 5     Pain Loc --      Pain Edu? --      Excl. in GC? --  Constitutional: Alert and oriented. Well appearing and in no acute distress. Eyes: Conjunctivae are normal.  Head: Atraumatic. Neck: No stridor.   Cardiovascular: Normal rate, regular rhythm. Grossly normal heart sounds.  Good peripheral circulation. Respiratory: Normal respiratory effort.  No retractions. Lungs CTAB. Gastrointestinal: Soft and nontender. No distention.  Bowel sounds are present throughout. Musculoskeletal: No lower extremity tenderness nor edema.  No joint effusions. Neurologic:  Normal speech and language. No gross focal neurologic deficits are appreciated. No gait instability. Skin:  Skin is warm, dry and intact.   There is a erythematous shiny macular rash under the right breast with discrete margins.  No vesicles or open skin noted. Psychiatric: Mood and affect are normal. Speech and behavior are normal.  ____________________________________________   LABS (all labs ordered are listed, but only abnormal results are displayed)  Labs Reviewed  POC URINE PREG, ED   RADIOLOGY I, Tommi Rumps, personally viewed and evaluated these images (plain radiographs) as part of my medical decision making, as well as reviewing the written report by the radiologist.   Official radiology report(s): DG Abdomen 1 View  Result Date: 12/14/2019 CLINICAL DATA:  Constipation for 3 weeks EXAM: ABDOMEN - 1 VIEW COMPARISON:  11/24/2019 abdominal radiographs FINDINGS: No dilated small bowel loops. Large colorectal stool volume, most prominent in the rectum. No evidence of pneumatosis or pneumoperitoneum. No radiopaque nephrolithiasis. IMPRESSION: Large colorectal stool volume, most prominent in the rectum, compatible with reported constipation. No evidence of bowel obstruction. Electronically Signed   By: Delbert Phenix M.D.   On: 12/14/2019 11:56    ____________________________________________   PROCEDURES  Procedure(s) performed (including Critical Care):  Procedures   ____________________________________________   INITIAL IMPRESSION / ASSESSMENT AND PLAN / ED COURSE  As part of my medical decision making, I reviewed the following data within the electronic MEDICAL RECORD NUMBER Notes from prior ED visits and [place] Controlled Substance Database  44 year old female presents to the ED with history of constipation for 1 week.  Patient was also here last month for the same complaint.  Patient was seen by her PCP this week and was told to use MiraLAX which she has used 1 tablespoon daily without any results.  Patient reports that she has been passing some gas.  X-ray is negative for an obstruction.  After mag citrate mixed  with Colace and a soapsuds enema patient was able to have a large bowel movement and is now feeling much better.  She mentioned a rash that was looked at and this appears to be more fungal/yeast in nature under her right breast.  Patient was given a prescription for Lotrimin cream to apply to area twice daily and to allow this area to get lots of air.  She is to follow-up with her PCP if any continued problems.  ____________________________________________   FINAL CLINICAL IMPRESSION(S) / ED DIAGNOSES  Final diagnoses:  Constipation, unspecified constipation type  Yeast infection of the skin     ED Discharge Orders         Ordered    clotrimazole (LOTRIMIN) 1 % cream  2 times daily        12/14/19 1626          *Please note:  Maria Rush was evaluated in Emergency Department on 12/14/2019 for the symptoms described in the history of present illness. She was evaluated in the context of the global COVID-19 pandemic, which necessitated consideration that the patient might be at risk for infection with the SARS-CoV-2 virus  that causes COVID-19. Institutional protocols and algorithms that pertain to the evaluation of patients at risk for COVID-19 are in a state of rapid change based on information released by regulatory bodies including the CDC and federal and state organizations. These policies and algorithms were followed during the patient's care in the ED.  Some ED evaluations and interventions may be delayed as a result of limited staffing during and the pandemic.*   Note:  This document was prepared using Dragon voice recognition software and may include unintentional dictation errors.    Tommi Rumps, PA-C 12/14/19 1634    Gilles Chiquito, MD 12/14/19 1731

## 2020-02-07 ENCOUNTER — Other Ambulatory Visit: Payer: Self-pay

## 2020-02-07 ENCOUNTER — Ambulatory Visit
Admission: EM | Admit: 2020-02-07 | Discharge: 2020-02-07 | Disposition: A | Discharge status: Home / Self Care | Payer: Commercial Managed Care - PPO | Attending: Family Medicine | Admitting: Family Medicine

## 2020-02-07 DIAGNOSIS — R350 Frequency of micturition: Secondary | ICD-10-CM | POA: Diagnosis present

## 2020-02-07 DIAGNOSIS — B373 Candidiasis of vulva and vagina: Secondary | ICD-10-CM | POA: Diagnosis not present

## 2020-02-07 DIAGNOSIS — B3731 Acute candidiasis of vulva and vagina: Secondary | ICD-10-CM

## 2020-02-07 LAB — URINALYSIS, COMPLETE (UACMP) WITH MICROSCOPIC
Glucose, UA: NEGATIVE mg/dL
Ketones, ur: NEGATIVE mg/dL
Nitrite: NEGATIVE
Protein, ur: 100 mg/dL — AB
RBC / HPF: >50 RBC/hpf (ref 0–5)
Specific Gravity, Urine: >1.03 — ABNORMAL HIGH (ref 1.005–1.030)
pH: 5.5 (ref 5.0–8.0)

## 2020-02-07 MED ORDER — NITROFURANTOIN MONOHYD MACRO 100 MG PO CAPS: 100.0000 mg | ORAL_CAPSULE | Freq: Two times a day (BID) | ORAL | 0 refills | Status: DC

## 2020-02-07 MED ORDER — FLUCONAZOLE 200 MG PO TABS: 200.0000 mg | ORAL_TABLET | Freq: Every day | ORAL | 0 refills | Status: AC

## 2020-02-07 NOTE — Discharge Instructions (Addendum)
Take the Macrobid twice daily for 5 days for the UTI.  Take 1 Diflucan tablet now and repeat in 1 week if you are still having symptoms.  Increase your oral water intake so that you increase your urine production and flush your urinary system.  Return for reevaluation if develop any new or worsening symptoms.

## 2020-02-07 NOTE — ED Triage Notes (Signed)
Pt is here with the frequency to urinate since Sunday, pt has not taken any meds to relieve discomfort.

## 2020-02-07 NOTE — ED Provider Notes (Signed)
[facility]    CSN: [id number] Arrival date & time: 02/07/20  0944      History   Chief Complaint Chief Complaint  Patient presents with  . Urinary Tract Infection    HPI Maria Rush is a 44 y.o. female.   HPI   44 year old female here for evaluation of urinary frequency and urgency.  Patient reports that she has had the symptoms for the past 5 days.  Patient states that she has seen blood in her urine but she is also on her menses right now.  Patient denies fever, pain with urination, cloudiness to her urine, back pain, vaginal discharge or itching, nausea or vomiting, or abdominal pain.  Past Medical History:  Diagnosis Date  . Bladder pain 12/16/2013  . Chronic interstitial cystitis 12/16/2013  . Goiter 11/28/2014  . Interstitial cystitis   . Nocturia 02/12/2013  . Recurrent UTI 02/12/2013  . Urinary urgency 02/12/2013    Patient Active Problem List   Diagnosis Date Noted  . Interstitial cystitis 08/23/2016  . UTI symptoms 05/27/2016  . Goiter 11/28/2014  . Bladder pain 12/16/2013  . Chronic interstitial cystitis 12/16/2013  . Increased frequency of urination 12/16/2013  . Nocturia 02/12/2013  . Recurrent UTI 02/12/2013  . Urinary urgency 02/12/2013    Past Surgical History:  Procedure Laterality Date  . CYSTOSCOPY WITH HYDRODISTENSION AND BIOPSY      OB History   No obstetric history on file.      Home Medications    Prior to Admission medications   Medication Sig Start Date End Date Taking? Authorizing Provider  fluconazole (DIFLUCAN) 200 MG tablet Take 1 tablet (200 mg total) by mouth daily for 2 days. Take 1 tablet now and repeat in 1 week if your symptoms still are present. 02/07/20 02/09/20 Yes Becky Augusta, NP  nitrofurantoin, macrocrystal-monohydrate, (MACROBID) 100 MG capsule Take 1 capsule (100 mg total) by mouth 2 (two) times daily. 02/07/20  Yes Becky Augusta, NP  amitriptyline (ELAVIL) 10 MG tablet Take by mouth. 12/23/19    [provider]  amitriptyline (ELAVIL) 25 MG tablet Take 1 tablet (25 mg total) by mouth at bedtime. 08/23/16   Jerilee Field, MD  clotrimazole (LOTRIMIN) 1 % cream Apply 1 application topically 2 (two) times daily. 12/14/19   Tommi Rumps, PA-C    Family History Family History  Problem Relation Age of Onset  . Breast cancer Mother     Social History Social History   Tobacco Use  . Smoking status: Never Smoker  . Smokeless tobacco: Never Used  Substance Use Topics  . Alcohol use: Yes  . Drug use: No     Allergies   Iodinated diagnostic agents, Silicone, and Sulfa antibiotics   Review of Systems Review of Systems  Constitutional: Negative for activity change, appetite change and fever.  Gastrointestinal: Negative for abdominal pain, nausea and vomiting.  Genitourinary: Positive for frequency, hematuria, urgency and vaginal bleeding. Negative for dysuria, vaginal discharge and vaginal pain.  Musculoskeletal: Negative for back pain.  Skin: Negative for rash.  Hematological: Negative.   Psychiatric/Behavioral: Negative.      Physical Exam Triage Vital Signs ED Triage Vitals  Enc Vitals Group     BP 02/07/20 1134 120/90     Pulse Rate 02/07/20 1134 82     Resp 02/07/20 1134 18     Temp 02/07/20 1134 98.1 F (36.7 C)     Temp Source 02/07/20 1134 Oral     SpO2 02/07/20 1134  100 %     Weight --      Height --      Head Circumference --      Peak Flow --      Pain Score 02/07/20 1132 0     Pain Loc --      Pain Edu? --      Excl. in GC? --    No data found.  Updated Vital Signs BP 120/90 (BP Location: Left Arm)   Pulse 82   Temp 98.1 F (36.7 C) (Oral)   Resp 18   LMP 02/02/2020   SpO2 100%   Visual Acuity Right Eye Distance:   Left Eye Distance:   Bilateral Distance:    Right Eye Near:   Left Eye Near:    Bilateral Near:     Physical Exam Vitals and nursing note reviewed.  Constitutional:      General: She is not in acute  distress.    Appearance: Normal appearance. She is not toxic-appearing.  HENT:     Head: Normocephalic and atraumatic.  Cardiovascular:     Rate and Rhythm: Normal rate and regular rhythm.     Pulses: Normal pulses.     Heart sounds: No murmur heard. No gallop.   Pulmonary:     Effort: Pulmonary effort is normal.     Breath sounds: Normal breath sounds. No wheezing, rhonchi or rales.  Abdominal:     Tenderness: There is no right CVA tenderness or left CVA tenderness.  Skin:    General: Skin is warm and dry.     Capillary Refill: Capillary refill takes less than 2 seconds.     Findings: No erythema or rash.  Neurological:     General: No focal deficit present.     Mental Status: She is alert and oriented to person, place, and time.  Psychiatric:        Mood and Affect: Mood normal.        Behavior: Behavior normal.        Thought Content: Thought content normal.        Judgment: Judgment normal.      UC Treatments / Results  Labs (all labs ordered are listed, but only abnormal results are displayed) Labs Reviewed  URINALYSIS, COMPLETE (UACMP) WITH MICROSCOPIC - Abnormal; Notable for the following components:      Result Value   APPearance CLOUDY (*)    Specific Gravity, Urine >1.030 (*)    Hgb urine dipstick LARGE (*)    Bilirubin Urine SMALL (*)    Protein, ur 100 (*)    Leukocytes,Ua SMALL (*)    Bacteria, UA MANY (*)    All other components within normal limits  URINE CULTURE    EKG   Radiology No results found.  Procedures Procedures (including critical care time)  Medications Ordered in UC Medications - No data to display  Initial Impression / Assessment and Plan / UC Course  I have reviewed the triage vital signs and the nursing notes.  Pertinent labs & imaging results that were available during my care of the patient were reviewed by me and considered in my medical decision making (see chart for details).   Patient is here for evaluation of  urinary urgency and frequency that been going on for last 5 days.  She denies pain with urination.  She is nontoxic in appearance.  Will send urinalysis for evaluation of the lab.  Urinalysis shows large blood, 100 protein, small leukocytes, 6-10  white cells, greater than 50 RBCs, many bacteria, and budding yeast.  Will send urine for culture.  Will treat for UTI with Macrobid twice daily x5 days and Diflucan for yeast.   Final Clinical Impressions(s) / UC Diagnoses   Final diagnoses:  Urinary frequency  Vaginal candidiasis     Discharge Instructions     Take the Macrobid twice daily for 5 days for the UTI.  Take 1 Diflucan tablet now and repeat in 1 week if you are still having symptoms.  Increase your oral water intake so that you increase your urine production and flush your urinary system.  Return for reevaluation if develop any new or worsening symptoms.    ED Prescriptions    Medication Sig Dispense Auth. Provider   nitrofurantoin, macrocrystal-monohydrate, (MACROBID) 100 MG capsule Take 1 capsule (100 mg total) by mouth 2 (two) times daily. 10 capsule Becky Augusta, NP   fluconazole (DIFLUCAN) 200 MG tablet Take 1 tablet (200 mg total) by mouth daily for 2 days. Take 1 tablet now and repeat in 1 week if your symptoms still are present. 2 tablet Becky Augusta, NP     PDMP not reviewed this encounter.   Becky Augusta, NP 02/07/20 1217

## 2020-02-11 LAB — URINE CULTURE
Culture: >=100000 — AB
Special Requests: NORMAL

## 2020-03-28 ENCOUNTER — Other Ambulatory Visit: Payer: Self-pay

## 2020-03-28 ENCOUNTER — Encounter: Payer: Self-pay | Admitting: Intensive Care

## 2020-03-28 ENCOUNTER — Emergency Department
Admission: EM | Admit: 2020-03-28 | Discharge: 2020-03-28 | Disposition: A | Discharge status: Home / Self Care | Payer: Commercial Managed Care - PPO | Attending: Emergency Medicine | Admitting: Emergency Medicine

## 2020-03-28 DIAGNOSIS — S6992XA Unspecified injury of left wrist, hand and finger(s), initial encounter: Secondary | ICD-10-CM | POA: Diagnosis present

## 2020-03-28 DIAGNOSIS — Y93G3 Activity, cooking and baking: Secondary | ICD-10-CM | POA: Diagnosis not present

## 2020-03-28 DIAGNOSIS — F17210 Nicotine dependence, cigarettes, uncomplicated: Secondary | ICD-10-CM | POA: Diagnosis not present

## 2020-03-28 DIAGNOSIS — W260XXA Contact with knife, initial encounter: Secondary | ICD-10-CM | POA: Insufficient documentation

## 2020-03-28 DIAGNOSIS — S61412A Laceration without foreign body of left hand, initial encounter: Secondary | ICD-10-CM | POA: Insufficient documentation

## 2020-03-28 MED ORDER — LIDOCAINE HCL (PF) 1 % IJ SOLN
5.0000 mL | Freq: Once | INTRAMUSCULAR | Status: AC
  Administered 2020-03-28: 5 mL
  Filled 2020-03-28: qty 5

## 2020-03-28 NOTE — Discharge Instructions (Addendum)
Keep the wound clean, dry, and covered.  Wash the hands as necessary.  Follow-up with your primary provider or local urgent care for suture removal in 10 to 12 days.

## 2020-03-28 NOTE — ED Triage Notes (Signed)
Patient has left hand laceration

## 2020-03-28 NOTE — ED Provider Notes (Signed)
[facility] Emergency Department Provider Note ____________________________________________  Time seen: 1710  I have reviewed the triage vital signs and the nursing notes.  HISTORY  Chief Complaint  Laceration  HPI Maria Rush is a 45 y.o. female presents herself to the ED for evaluation of an axonal laceration to the left hand.  Patient was reportedly cutting an apple with a large serrated knife, when her hand slipped.  Caused a dorsal laceration between the first and second metacarpals.  Patient presents now with bleeding control.  She denies any distal paresthesias or grip changes.  She denies any other injury at this time.  Tetanus status is unknown.   Past Medical History:  Diagnosis Date  . Bladder pain 12/16/2013  . Chronic interstitial cystitis 12/16/2013  . Goiter 11/28/2014  . Interstitial cystitis   . Nocturia 02/12/2013  . Recurrent UTI 02/12/2013  . Urinary urgency 02/12/2013    Patient Active Problem List   Diagnosis Date Noted  . Interstitial cystitis 08/23/2016  . UTI symptoms 05/27/2016  . Goiter 11/28/2014  . Bladder pain 12/16/2013  . Chronic interstitial cystitis 12/16/2013  . Increased frequency of urination 12/16/2013  . Nocturia 02/12/2013  . Recurrent UTI 02/12/2013  . Urinary urgency 02/12/2013    Past Surgical History:  Procedure Laterality Date  . CYSTOSCOPY WITH HYDRODISTENSION AND BIOPSY      Prior to Admission medications   Medication Sig Start Date End Date Taking? Authorizing Provider  amitriptyline (ELAVIL) 10 MG tablet Take by mouth. 12/23/19   [provider]  amitriptyline (ELAVIL) 25 MG tablet Take 1 tablet (25 mg total) by mouth at bedtime. 08/23/16   Jerilee Field, MD  clotrimazole (LOTRIMIN) 1 % cream Apply 1 application topically 2 (two) times daily. 12/14/19   Tommi Rumps, PA-C  nitrofurantoin, macrocrystal-monohydrate, (MACROBID) 100 MG capsule Take 1 capsule (100 mg total) by mouth 2 (two)  times daily. 02/07/20   Becky Augusta, NP    Allergies Iodinated diagnostic agents, Silicone, and Sulfa antibiotics  Family History  Problem Relation Age of Onset  . Breast cancer Mother     Social History Social History   Tobacco Use  . Smoking status: Current Every Day Smoker    Types: E-cigarettes  . Smokeless tobacco: Never Used  Vaping Use  . Vaping Use: Some days  Substance Use Topics  . Alcohol use: Yes  . Drug use: No    Review of Systems  Constitutional: Negative for fever. Cardiovascular: Negative for chest pain. Respiratory: Negative for shortness of breath. Gastrointestinal: Negative for abdominal pain, vomiting and diarrhea. Genitourinary: Negative for dysuria. Musculoskeletal: Negative for back pain. Skin: Negative for rash.  Left hand laceration as above. Neurological: Negative for headaches, focal weakness or numbness. ____________________________________________  PHYSICAL EXAM:  VITAL SIGNS: ED Triage Vitals  Enc Vitals Group     BP 03/28/20 1647 124/84     Pulse Rate 03/28/20 1647 98     Resp 03/28/20 1647 14     Temp 03/28/20 1647 98.7 F (37.1 C)     Temp Source 03/28/20 1647 Oral     SpO2 03/28/20 1647 96 %     Weight 03/28/20 1648 170 lb (77.1 kg)     Height 03/28/20 1648 4\' 11"  (1.499 m)     Head Circumference --      Peak Flow --      Pain Score 03/28/20 1648 6     Pain Loc --      Pain Edu? --  Excl. in GC? --     Constitutional: Alert and oriented. Well appearing and in no distress. Head: Normocephalic and atraumatic. Eyes: Conjunctivae are normal. Normal extraocular movements Cardiovascular: Normal rate, regular rhythm. Normal distal pulses. Respiratory: Normal respiratory effort.  Musculoskeletal: Left hand without obvious deformity or dislocation.  Patient is noted to have a dorsal left hand laceration measuring about 2 cm in length.  There is some exposure of the subcutaneous fat.  Patient is able demonstrate a normal  composite fist on the left.  Nontender with normal range of motion in all extremities.  Neurologic: Cranial nerves II through XII grossly intact.  Normal gross sensation.  Normal intrinsic and opposition testing noted.  Normal speech and language. No gross focal neurologic deficits are appreciated. Skin:  Skin is warm, dry and intact. No rash noted. ____________________________________________  PROCEDURES  .Marland KitchenLaceration Repair  Date/Time: 03/28/2020 5:51 PM Performed by: Frederic Jericho, Student-PA Authorized by: Lissa Hoard, PA-C   Consent:    Consent obtained:  Verbal   Consent given by:  Patient   Risks, benefits, and alternatives were discussed: yes     Risks discussed:  Pain and poor wound healing Universal protocol:    Procedure explained and questions answered to patient or proxy's satisfaction: yes     Site/side marked: yes     Patient identity confirmed:  Verbally with patient Anesthesia:    Anesthesia method:  Local infiltration   Local anesthetic:  Lidocaine 1% w/o epi Laceration details:    Location:  Hand   Hand location:  L hand, dorsum   Length (cm):  2   Depth (mm):  4 Pre-procedure details:    Preparation:  Patient was prepped and draped in usual sterile fashion Exploration:    Hemostasis achieved with:  Direct pressure   Contaminated: no   Treatment:    Area cleansed with:  Saline and povidone-iodine   Amount of cleaning:  Standard   Irrigation solution:  Sterile saline   Irrigation method:  Syringe   Debridement:  None   Undermining:  None   Scar revision: no   Skin repair:    Repair method:  Sutures   Suture size:  4-0   Suture material:  Nylon   Suture technique:  Simple interrupted   Number of sutures:  3 Approximation:    Approximation:  Close Repair type:    Repair type:  Simple Post-procedure details:    Dressing:  Non-adherent dressing and bulky dressing   Procedure completion:  Tolerated well, no immediate  complications  ____________________________________________  INITIAL IMPRESSION / ASSESSMENT AND PLAN / ED COURSE  Patient with ED evaluation and management of an accidental laceration to left hand.  Patient sustained a dorsal left hand laceration after attempted to cut an apple with a large knife.  Wound is prepped and draped in normal sterile fashion and good wound edge approximation is achieved using nylon sutures.  He discharged wound care instructions and supplies.  She will see her primary provider or local urgent care for suture removal in 10 to 12 days.  Maria Rush was evaluated in Emergency Department on 03/28/2020 for the symptoms described in the history of present illness. She was evaluated in the context of the global COVID-19 pandemic, which necessitated consideration that the patient might be at risk for infection with the SARS-CoV-2 virus that causes COVID-19. Institutional protocols and algorithms that pertain to the evaluation of patients at risk for COVID-19 are in a state of rapid  change based on information released by regulatory bodies including the CDC and federal and state organizations. These policies and algorithms were followed during the patient's care in the ED. ____________________________________________  FINAL CLINICAL IMPRESSION(S) / ED DIAGNOSES  Final diagnoses:  Laceration of left hand without foreign body, initial encounter      Lissa Hoard, PA-C 03/28/20 Raquel James, MD 03/29/20 [phone]

## 2020-03-28 NOTE — ED Notes (Signed)
Laceration to top of left hand noted, pt states she cut herself with a kitchen knife cutting an apple today, pt reports profuse bleeding at time of injury. Pt reports she cleaned wound with alcohol and neosporin, Band-Aid over cut. No bleeding noted at this time, laceration is approximately 0.75" long, adipose tissue visible. Pt does not know when she had last tetanus shot.

## 2020-10-27 ENCOUNTER — Emergency Department
Admission: EM | Admit: 2020-10-27 | Discharge: 2020-10-27 | Disposition: A | Discharge status: Home / Self Care | Payer: Commercial Managed Care - PPO | Attending: Emergency Medicine | Admitting: Emergency Medicine

## 2020-10-27 ENCOUNTER — Other Ambulatory Visit: Payer: Self-pay

## 2020-10-27 DIAGNOSIS — R0981 Nasal congestion: Secondary | ICD-10-CM | POA: Insufficient documentation

## 2020-10-27 DIAGNOSIS — N390 Urinary tract infection, site not specified: Secondary | ICD-10-CM | POA: Diagnosis not present

## 2020-10-27 DIAGNOSIS — U071 COVID-19: Secondary | ICD-10-CM | POA: Diagnosis not present

## 2020-10-27 DIAGNOSIS — F17210 Nicotine dependence, cigarettes, uncomplicated: Secondary | ICD-10-CM | POA: Insufficient documentation

## 2020-10-27 DIAGNOSIS — R070 Pain in throat: Secondary | ICD-10-CM | POA: Diagnosis present

## 2020-10-27 LAB — RESP PANEL BY RT-PCR (FLU A&B, COVID) ARPGX2
Influenza A by PCR: NEGATIVE
Influenza B by PCR: NEGATIVE
SARS Coronavirus 2 by RT PCR: POSITIVE — AB

## 2020-10-27 LAB — URINALYSIS, COMPLETE (UACMP) WITH MICROSCOPIC
Bilirubin Urine: NEGATIVE
Glucose, UA: NEGATIVE mg/dL
Nitrite: NEGATIVE
Specific Gravity, Urine: 1.025 (ref 1.005–1.030)
Squamous Epithelial / HPF: >50 — ABNORMAL HIGH (ref 0–5)
WBC, UA: >50 WBC/hpf — ABNORMAL HIGH (ref 0–5)
pH: 5.5 (ref 5.0–8.0)

## 2020-10-27 LAB — GROUP A STREP BY PCR: Group A Strep by PCR: NOT DETECTED

## 2020-10-27 LAB — POC URINE PREG, ED: Preg Test, Ur: NEGATIVE

## 2020-10-27 MED ORDER — NIRMATRELVIR/RITONAVIR (PAXLOVID)TABLET: 3.0000 | ORAL_TABLET | Freq: Two times a day (BID) | ORAL | 0 refills | Status: AC

## 2020-10-27 NOTE — ED Provider Notes (Signed)
[facility] Emergency Department Provider Note  ____________________________________________   Event Date/Time   First MD Initiated Contact with Patient 10/27/20 [phone]     (approximate)  I have reviewed the triage vital signs and the nursing notes.   HISTORY  Chief Complaint Sore Throat    HPI Maria Rush is a 45 y.o. female presents emergency department with sore throat for 4 days.  Some nausea.  No vomiting or diarrhea.  Congestion and cough noted.  Patient states she did a home COVID test which was negative.  Went to the urgent care and had a negative COVID test and flu test.  They did not check her for strep.  States it hurts very badly to swallow.  Low-grade fever.  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 Authorizing Provider  nirmatrelvir/ritonavir EUA (PAXLOVID) 20 x 150 MG & 10 x 100MG  TABS Take 3 tablets by mouth 2 (two) times daily for 5 days. Patient GFR is >60 Take nirmatrelvir (150 mg) two tablets twice daily for 5 days and ritonavir (100 mg) one tablet twice daily for 5 days. 10/27/20 11/01/20 Yes Aundrey Elahi, 11/03/20, PA-C  amitriptyline (ELAVIL) 10 MG tablet Take by mouth. 12/23/19   [provider]   amitriptyline (ELAVIL) 25 MG tablet Take 1 tablet (25 mg total) by mouth at bedtime. 08/23/16   08/25/16, MD  clotrimazole (LOTRIMIN) 1 % cream Apply 1 application topically 2 (two) times daily. 12/14/19   13/6/21, PA-C  nitrofurantoin, macrocrystal-monohydrate, (MACROBID) 100 MG capsule Take 1 capsule (100 mg total) by mouth 2 (two) times daily. 02/07/20   02/09/20, NP    Allergies Iodinated diagnostic agents, Silicone, and Sulfa antibiotics  Family History  Problem Relation Age of Onset   Breast cancer Mother     Social History Social History   Tobacco Use   Smoking status: Every Day    Types: E-cigarettes   Smokeless tobacco: Never  Vaping Use   Vaping Use: Some days  Substance Use Topics   Alcohol use: Yes   Drug use: No    Review of Systems  Constitutional: Positive fever/chills Eyes: No visual changes. ENT: Positive sore throat. Respiratory: Positive cough Cardiovascular: Denies chest pain Gastrointestinal: Denies abdominal pain Genitourinary: Negative for dysuria. Musculoskeletal: Negative for back pain. Skin: Negative for rash. Psychiatric: no mood changes,     ____________________________________________   PHYSICAL EXAM:  VITAL SIGNS: ED Triage Vitals  Enc Vitals Group     BP 10/27/20 0802 127/90     Pulse Rate 10/27/20 0802 (!) 130     Resp 10/27/20 0802 18     Temp 10/27/20 0802 99.7 F (37.6 C)     Temp src --  SpO2 10/27/20 0802 100 %     Weight --      Height --      Head Circumference --      Peak Flow --      Pain Score 10/27/20 0804 7     Pain Loc --      Pain Edu? --      Excl. in GC? --     Constitutional: Alert and oriented. Well appearing and in no acute distress. Eyes: Conjunctivae are normal.  Head: Atraumatic. Nose: No congestion/rhinnorhea. Mouth/Throat: Mucous membranes are moist.  Throat is red and irritated posteriorly Neck:  supple no lymphadenopathy noted Cardiovascular: Normal rate,  regular rhythm. Heart sounds are normal Respiratory: Normal respiratory effort.  No retractions, lungs c t a  Abd: soft nontender bs normal all 4 quad GU: deferred Musculoskeletal: FROM all extremities, warm and well perfused Neurologic:  Normal speech and language.  Skin:  Skin is warm, dry and intact. No rash noted. Psychiatric: Mood and affect are normal. Speech and behavior are normal.  ____________________________________________   LABS (all labs ordered are listed, but only abnormal results are displayed)  Labs Reviewed  RESP PANEL BY RT-PCR (FLU A&B, COVID) ARPGX2 - Abnormal; Notable for the following components:      Result Value   SARS Coronavirus 2 by RT PCR POSITIVE (*)    All other components within normal limits  URINALYSIS, COMPLETE (UACMP) WITH MICROSCOPIC - Abnormal; Notable for the following components:   APPearance HAZY (*)    Hgb urine dipstick LARGE (*)    Ketones, ur TRACE (*)    Protein, ur TRACE (*)    Leukocytes,Ua MODERATE (*)    WBC, UA >50 (*)    Bacteria, UA MANY (*)    Squamous Epithelial / LPF >50 (*)    All other components within normal limits  GROUP A STREP BY PCR  POC URINE PREG, ED   ____________________________________________   ____________________________________________  RADIOLOGY    ____________________________________________   PROCEDURES  Procedure(s) performed: No  Procedures    ____________________________________________   INITIAL IMPRESSION / ASSESSMENT AND PLAN / ED COURSE  Pertinent labs & imaging results that were available during my care of the patient were reviewed by me and considered in my medical decision making (see chart for details).   Patient's 45 year old female presents with COVID-like symptoms.  See HPI.  Physical exam shows patient per stable.  Strep test, COVID test/flu test, UA ordered  Strep test negative, COVID test positive, UA does show infection.  Explained the findings to the  patient.  Heart rate had returned to 101.  Do not feel that she needs fluids at this time.  Patient does already have a prescription for an antibiotic for the UTI.  She just has not picked it up.  She was given prescription for paxlovid.  Instructed to pick up the antibiotic as she could become septic if she does not take her antibiotic.  Return emergency department worsening.  Follow-up with your regular doctor if not improving to 3 days.  Discharged in stable condition and given a work note  Maria Rush was evaluated in Emergency Department on 10/27/2020 for the symptoms described in the history of present illness. She was evaluated in the context of the global COVID-19 pandemic, which necessitated consideration that the patient might be at risk for infection with the SARS-CoV-2 virus that causes COVID-19. Institutional protocols and algorithms that pertain to the evaluation of patients at risk for COVID-19  are in a state of rapid change based on information released by regulatory bodies including the CDC and federal and state organizations. These policies and algorithms were followed during the patient's care in the ED.    As part of my medical decision making, I reviewed the following data within the electronic MEDICAL RECORD NUMBER Nursing notes reviewed and incorporated, Labs reviewed , Old chart reviewed, Radiograph reviewed , Notes from prior ED visits, and [place] Controlled Substance Database  ____________________________________________   FINAL CLINICAL IMPRESSION(S) / ED DIAGNOSES  Final diagnoses:  COVID-19  Acute UTI      NEW MEDICATIONS STARTED DURING THIS VISIT:  Discharge Medication List as of 10/27/2020 11:01 AM     START taking these medications   Details  nirmatrelvir/ritonavir EUA (PAXLOVID) 20 x 150 MG & 10 x 100MG  TABS Take 3 tablets by mouth 2 (two) times daily for 5 days. Patient GFR is >60 Take nirmatrelvir (150 mg) two tablets twice daily for 5 days and ritonavir (100 mg)  one tablet twice daily for 5 days., Starting Tue 10/27/2020, Until Sun 11/01/2020, Normal         Note:  This document was prepared using Dragon voice recognition software and may include unintentional dictation errors.    11/03/2020, PA-C 10/27/20 1306    10/29/20, MD 10/28/20 [phone]

## 2020-10-27 NOTE — ED Triage Notes (Signed)
Pt come with c/o severe sore throat. Pt states neg test on Sunday. Pt states she had covid like symptoms and is just not feeling better.

## 2020-10-27 NOTE — ED Notes (Signed)
See triage note  presents with sore throat and body aches for couple of days   low grade temp on arrival

## 2020-10-27 NOTE — Discharge Instructions (Signed)

## 2021-02-14 ENCOUNTER — Encounter: Payer: Self-pay | Admitting: Emergency Medicine

## 2021-02-14 ENCOUNTER — Emergency Department
Admission: EM | Admit: 2021-02-14 | Discharge: 2021-02-14 | Disposition: A | Discharge status: Home / Self Care | Payer: Commercial Managed Care - PPO | Attending: Student in an Organized Health Care Education/Training Program | Admitting: Student in an Organized Health Care Education/Training Program

## 2021-02-14 ENCOUNTER — Other Ambulatory Visit: Payer: Self-pay

## 2021-02-14 DIAGNOSIS — K5903 Drug induced constipation: Secondary | ICD-10-CM | POA: Insufficient documentation

## 2021-02-14 DIAGNOSIS — K5900 Constipation, unspecified: Secondary | ICD-10-CM | POA: Diagnosis present

## 2021-02-14 MED ORDER — MAGNESIUM CITRATE PO SOLN
1.0000 | Freq: Once | ORAL | 0 refills | Status: AC
Start: 2021-02-14 — End: 2021-02-14

## 2021-02-14 MED ORDER — SENNOSIDES-DOCUSATE SODIUM 8.6-50 MG PO TABS: 2.0000 | ORAL_TABLET | Freq: Every day | ORAL | 2 refills | Status: AC

## 2021-02-14 NOTE — Discharge Instructions (Addendum)
In addition to the medications, take daily MiraLAX which is over-the-counter to help prevent constipation.

## 2021-02-14 NOTE — ED Provider Notes (Signed)
[facility] Provider Note  Patient Contact: 5:40 PM (approximate)   History   Constipation   HPI  Maria Rush is a 46 y.o. female who presents to the emergency department complaining of constipation.  Patient states that every few weeks she suffers from constipation.  She seen her primary care for same, was initially prescribed Linzess.  She states that the medication require preauthorization which she did not receive.  Patient has been trying over-the-counter/supplements to try to alleviate her constipation.  Patient has no abdominal pain.  No emesis.  She is looking for medications to help alleviate her constipation at this time.     Physical Exam   Triage Vital Signs: ED Triage Vitals  Enc Vitals Group     BP 02/14/21 1555 (!) 139/97     Pulse Rate 02/14/21 1555 (!) 102     Resp 02/14/21 1555 16     Temp 02/14/21 1555 98.4 F (36.9 C)     Temp Source 02/14/21 1555 Oral     SpO2 02/14/21 1555 97 %     Weight 02/14/21 1537 162 lb (73.5 kg)     Height 02/14/21 1537 4\' 11"  (1.499 m)     Head Circumference --      Peak Flow --      Pain Score 02/14/21 1537 0     Pain Loc --      Pain Edu? --      Excl. in GC? --     Most recent vital signs: Vitals:   02/14/21 1555  BP: (!) 139/97  Pulse: (!) 102  Resp: 16  Temp: 98.4 F (36.9 C)  SpO2: 97%     General: Alert and in no acute distress. Cardiovascular:  Good peripheral perfusion Respiratory: Normal respiratory effort without tachypnea or retractions. Lungs CTAB. Gastrointestinal: Bowel sounds 4 quadrants. Soft and nontender to palpation. No guarding or rigidity. No palpable masses. No distention. No CVA tenderness. Musculoskeletal: Full range of motion to all extremities.  Neurologic:  No gross focal neurologic deficits are appreciated.  Skin:   No rash noted Other:   ED Results / Procedures / Treatments   Labs (all labs ordered are listed, but only abnormal results are  displayed) Labs Reviewed - No data to display   EKG     RADIOLOGY    No results found.  PROCEDURES:  Critical Care performed: No  Procedures   MEDICATIONS ORDERED IN ED: Medications - No data to display   IMPRESSION / MDM / ASSESSMENT AND PLAN / ED COURSE  I reviewed the triage vital signs and the nursing notes.                              Differential diagnosis includes, but is not limited to, constipation, bowel obstruction   Patient's diagnosis is consistent with constipation.  Patient presented to the emergency department with a history of recurrent constipation.  Patient has not had a normal bowel movement in a week.  No abdominal pain.  No emesis.  She states that given her recurrent constipation she was initially prescribed Linzess, however her insurance requires preauthorization which she did not receive.  Patient has tried oral supplements but has not taken any medications for constipation.  We discussed imaging versus treatment.  And treatment options included enema here versus at home treatment.  Patient prefers medications at home.  I will prescribe magnesium citrate and Senokot for  the patient.  Return precautions discussed with the patient.  Otherwise follow-up primary care as needed. Patient is given ED precautions to return to the ED for any worsening or new symptoms.        FINAL CLINICAL IMPRESSION(S) / ED DIAGNOSES   Final diagnoses:  Drug-induced constipation     Rx / DC Orders   ED Discharge Orders          Ordered    magnesium citrate SOLN   Once       Note to Pharmacy: 3 bottles   02/14/21 1742    senna-docusate (SENOKOT-S) 8.6-50 MG tablet  Daily        02/14/21 1742             Note:  This document was prepared using Dragon voice recognition software and may include unintentional dictation errors.   Lanette Hampshire 02/14/21 1746    Willy Eddy, MD 02/14/21 2025

## 2021-02-14 NOTE — ED Triage Notes (Signed)
Pt reports is constipated and has not had a BM since last Sunday. Pt reports has tried OTC meds with no relief. Pt has hx of the same and has been working with MD to get a new med to help but her ins has not approved it.

## 2021-09-30 ENCOUNTER — Emergency Department
Admission: EM | Admit: 2021-09-30 | Discharge: 2021-09-30 | Disposition: A | Discharge status: Home / Self Care | Payer: Commercial Managed Care - PPO | Attending: Emergency Medicine | Admitting: Emergency Medicine

## 2021-09-30 ENCOUNTER — Other Ambulatory Visit: Payer: Self-pay

## 2021-09-30 DIAGNOSIS — Z20822 Contact with and (suspected) exposure to covid-19: Secondary | ICD-10-CM | POA: Insufficient documentation

## 2021-09-30 DIAGNOSIS — J029 Acute pharyngitis, unspecified: Secondary | ICD-10-CM | POA: Insufficient documentation

## 2021-09-30 DIAGNOSIS — N390 Urinary tract infection, site not specified: Secondary | ICD-10-CM | POA: Diagnosis present

## 2021-09-30 DIAGNOSIS — Z8616 Personal history of COVID-19: Secondary | ICD-10-CM | POA: Insufficient documentation

## 2021-09-30 LAB — URINALYSIS, ROUTINE W REFLEX MICROSCOPIC
Bilirubin Urine: NEGATIVE
Glucose, UA: NEGATIVE mg/dL
Ketones, ur: NEGATIVE mg/dL
Nitrite: NEGATIVE
Protein, ur: NEGATIVE mg/dL
Specific Gravity, Urine: 1.017 (ref 1.005–1.030)
pH: 5 (ref 5.0–8.0)

## 2021-09-30 LAB — RESP PANEL BY RT-PCR (FLU A&B, COVID) ARPGX2
Influenza A by PCR: NEGATIVE
Influenza B by PCR: NEGATIVE
SARS Coronavirus 2 by RT PCR: NEGATIVE

## 2021-09-30 LAB — PREGNANCY, URINE: Preg Test, Ur: NEGATIVE

## 2021-09-30 MED ORDER — CEFDINIR 300 MG PO CAPS: 300.0000 mg | ORAL_CAPSULE | Freq: Two times a day (BID) | ORAL | 0 refills | Status: AC

## 2021-09-30 NOTE — ED Provider Triage Note (Signed)
Emergency Medicine Provider Triage Evaluation Note  Maria Rush , a 46 y.o. female  was evaluated in triage.  Pt complains of urinary burning, frequency, urgency.  She is also requesting a COVID test because her child was here last night and was tested positive for COVID.  She reports that she has a sore throat.  Review of Systems  Positive: Sore throat, dysuria Negative: Abdominal pain, flank pain, fever  Physical Exam  BP (!) 137/91   Pulse 100   Temp 98 F (36.7 C)   Resp 18   SpO2 99%  Gen:   Awake, no distress   Resp:  Normal effort  MSK:   Moves extremities without difficulty  Other:    Medical Decision Making  Medically screening exam initiated at 2:08 PM.  Appropriate orders placed.  Maria Rush was informed that the remainder of the evaluation will be completed by another provider, this initial triage assessment does not replace that evaluation, and the importance of remaining in the ED until their evaluation is complete.     Jackelyn Hoehn, PA-C 09/30/21 1409

## 2021-09-30 NOTE — ED Triage Notes (Signed)
Pt comes with c/o UTI, sore throat and covid exposure.

## 2021-09-30 NOTE — Discharge Instructions (Signed)
Follow-up with your primary care provider if any continued problems or concerns and also to have your urine rechecked after finishing the antibiotic.  Cefdinir was sent to the pharmacy to begin taking today and then twice a day until completely finished.  Increase fluids to stay hydrated.  Your COVID test today is negative.  Because you have 2 people in your house that are positive for COVID if you began having symptoms you will need to retest.  This can be done with a at home test which are accurate, urgent care or return to the emergency department.  Tylenol if needed for throat pain.

## 2021-09-30 NOTE — ED Provider Notes (Signed)
[facility] Provider Note    Event Date/Time   First MD Initiated Contact with Patient 09/30/21 1435     (approximate)   History   UTI   HPI Maria Rush is a 46 y.o. female   presents to the ED with symptoms of a urinary tract infection, sore throat and also is concerned as she was exposed to COVID yesterday.  Patient reports that she had a UTI approximately 1-1/2 months ago and was prescribed clindamycin by her PCP for both a UTI and "a skin bump".  Patient states that she never felt like she got over her UTI symptoms.  Also to people stated her house last evening who tested positive for COVID.  Patient's only symptom at this time is a sore throat.  She denies any fever, chills, headache, body aches, nausea, vomiting or diarrhea.      Physical Exam   Triage Vital Signs: ED Triage Vitals  Enc Vitals Group     BP 09/30/21 1406 (!) 137/91     Pulse Rate 09/30/21 1406 100     Resp 09/30/21 1406 18     Temp 09/30/21 1406 98 F (36.7 C)     Temp src --      SpO2 09/30/21 1406 99 %     Weight 09/30/21 1443 162 lb 0.6 oz (73.5 kg)     Height 09/30/21 1443 4\' 11"  (1.499 m)     Head Circumference --      Peak Flow --      Pain Score 09/30/21 1405 0     Pain Loc --      Pain Edu? --      Excl. in GC? --     Most recent vital signs: Vitals:   09/30/21 1406 09/30/21 1632  BP: (!) 137/91 130/88  Pulse: 100 94  Resp: 18 18  Temp: 98 F (36.7 C)   SpO2: 99% 99%     General: Awake, no distress.  CV:  Good peripheral perfusion.  Resp:  Normal effort.  Lungs are clear bilaterally. Abd:  No distention.  Other:  Posterior pharynx without erythema or exudate.  Neck is supple without cervical lymphadenopathy.  No CVA tenderness appreciated.  Patient is amatory without any assistance.   ED Results / Procedures / Treatments   Labs (all labs ordered are listed, but only abnormal results are displayed) Labs Reviewed  URINALYSIS, ROUTINE W REFLEX  MICROSCOPIC - Abnormal; Notable for the following components:      Result Value   Color, Urine YELLOW (*)    APPearance CLOUDY (*)    Hgb urine dipstick SMALL (*)    Leukocytes,Ua LARGE (*)    Bacteria, UA RARE (*)    All other components within normal limits  RESP PANEL BY RT-PCR (FLU A&B, COVID) ARPGX2  URINE CULTURE  PREGNANCY, URINE       PROCEDURES:  Critical Care performed:   Procedures   MEDICATIONS ORDERED IN ED: Medications - No data to display   IMPRESSION / MDM / ASSESSMENT AND PLAN / ED COURSE  I reviewed the triage vital signs and the nursing notes.   Differential diagnosis includes, but is not limited to, urinary tract infection, cystitis, COVID, influenza, strep pharyngitis, viral pharyngitis.  46 year old female presents to the ED with multiple complaints and concerns of continued urinary tract infection, sore throat and exposure to COVID last evening with minimal symptoms today.  Physical exam was less concerning for strep pharyngitis as  noted above.  COVID test was negative.  Urinalysis did show 21-50 WBCs with rare bacteria and was cloudy.  Patient was made aware and we discussed antibiotics for this.  Also she is to follow-up with her PCP to have her urine rechecked after finishing this antibiotic.  Also a urine culture was ordered that her PCP should be able to get reports from.  Patient was given instructions to increase fluids, Tylenol and should her symptoms worsen with fever, chills and body aches she is to retest for COVID since she has close exposure in these people will be remaining in her home.      Patient's presentation is most consistent with acute complicated illness / injury requiring diagnostic workup.  FINAL CLINICAL IMPRESSION(S) / ED DIAGNOSES   Final diagnoses:  Acute urinary tract infection  Exposure to COVID-19 virus  Pharyngitis, unspecified etiology     Rx / DC Orders   ED Discharge Orders          Ordered    cefdinir  (OMNICEF) 300 MG capsule  2 times daily        09/30/21 1615             Note:  This document was prepared using Dragon voice recognition software and may include unintentional dictation errors.   Tommi Rumps, PA-C 09/30/21 1633    Merwyn Katos, MD 09/30/21 [phone]

## 2021-10-02 LAB — URINE CULTURE

## 2021-10-03 ENCOUNTER — Other Ambulatory Visit: Payer: Self-pay

## 2021-10-03 ENCOUNTER — Emergency Department
Admission: EM | Admit: 2021-10-03 | Discharge: 2021-10-03 | Disposition: A | Discharge status: Home / Self Care | Payer: Commercial Managed Care - PPO | Attending: Emergency Medicine | Admitting: Emergency Medicine

## 2021-10-03 DIAGNOSIS — R5381 Other malaise: Secondary | ICD-10-CM | POA: Diagnosis present

## 2021-10-03 DIAGNOSIS — U071 COVID-19: Secondary | ICD-10-CM | POA: Diagnosis not present

## 2021-10-03 MED ORDER — NIRMATRELVIR/RITONAVIR (PAXLOVID)TABLET: 3.0000 | ORAL_TABLET | Freq: Two times a day (BID) | ORAL | 0 refills | Status: AC

## 2021-10-03 NOTE — ED Provider Notes (Signed)
[facility] Emergency Department Provider Note     Event Date/Time   First MD Initiated Contact with Patient 10/03/21 2050     (approximate)   History   Generalized Body Aches   HPI  Maria Rush is a 46 y.o. female presents to the ED with reports of a positive home COVID test following COVID exposure and her daughter was who was evaluated in the ED last week.  Patient is requesting a prescription for Paxlovid at this time but she denies any significant chest pain, shortness of breath, or weakness.  She does endorse generalized malaise and body aches.     Physical Exam   Triage Vital Signs: ED Triage Vitals  Enc Vitals Group     BP 10/03/21 1804 (!) 150/95     Pulse Rate 10/03/21 1804 85     Resp 10/03/21 1804 19     Temp 10/03/21 1804 98.7 F (37.1 C)     Temp Source 10/03/21 1804 Oral     SpO2 10/03/21 1804 98 %     Weight 10/03/21 1805 162 lb (73.5 kg)     Height 10/03/21 1805 4\' 11"  (1.499 m)     Head Circumference --      Peak Flow --      Pain Score 10/03/21 1805 9     Pain Loc --      Pain Edu? --      Excl. in GC? --     Most recent vital signs: Vitals:   10/03/21 1804 10/03/21 1807  BP: (!) 150/95   Pulse: 85 100  Resp: 19   Temp: 98.7 F (37.1 C)   SpO2: 98%     General Awake, no distress.  CV:  Good peripheral perfusion.  RESP:  Normal effort.  ABD:  No distention.     ED Results / Procedures / Treatments   Labs (all labs ordered are listed, but only abnormal results are displayed) Labs Reviewed - No data to display   EKG   RADIOLOGY   No results found.   PROCEDURES:  Critical Care performed: No  Procedures   MEDICATIONS ORDERED IN ED: Medications - No data to display   IMPRESSION / MDM / ASSESSMENT AND PLAN / ED COURSE  I reviewed the triage vital signs and the nursing notes.                              Differential diagnosis includes, but is not limited to, COVID, viral URI, flu,  RSV  Patient's presentation is most consistent with acute, uncomplicated illness.  Patient's diagnosis is consistent with home test confirmation of COVID-19.  Reviewed the patient's chart via care everywhere, reveals lab work done in July 2022 confirming normal kidney function.  Patient will be discharged home with prescriptions for Paxlovid. Patient is to follow up with primary provider as needed as needed or otherwise directed. Patient is given ED precautions to return to the ED for any worsening or new symptoms.     FINAL CLINICAL IMPRESSION(S) / ED DIAGNOSES   Final diagnoses:  COVID-19     Rx / DC Orders   ED Discharge Orders          Ordered    nirmatrelvir/ritonavir EUA (PAXLOVID) 20 x 150 MG & 10 x 100MG  TABS  2 times daily        10/03/21 2052  Note:  This document was prepared using Dragon voice recognition software and may include unintentional dictation errors.    Lissa Hoard, PA-C 10/04/21 2030    Pilar Jarvis, MD 10/05/21 Burna Mortimer

## 2021-10-03 NOTE — Discharge Instructions (Signed)
Take the Paxlovid as directed. Follow-up with your provider or return if needed.

## 2021-10-03 NOTE — ED Triage Notes (Addendum)
Pt arrives with c/o bodyaches, headache, cough, and congestion. Pt denies CP or SOB. Pt had positive COVID test at home today.

## 2022-02-13 ENCOUNTER — Emergency Department: Payer: BC Managed Care – PPO

## 2022-02-13 ENCOUNTER — Other Ambulatory Visit: Payer: Self-pay

## 2022-02-13 ENCOUNTER — Emergency Department
Admission: EM | Admit: 2022-02-13 | Discharge: 2022-02-13 | Disposition: A | Discharge status: Home / Self Care | Payer: BC Managed Care – PPO | Attending: Emergency Medicine | Admitting: Emergency Medicine

## 2022-02-13 DIAGNOSIS — N3090 Cystitis, unspecified without hematuria: Secondary | ICD-10-CM | POA: Diagnosis not present

## 2022-02-13 DIAGNOSIS — R21 Rash and other nonspecific skin eruption: Secondary | ICD-10-CM | POA: Diagnosis present

## 2022-02-13 LAB — URINALYSIS, ROUTINE W REFLEX MICROSCOPIC
Bilirubin Urine: NEGATIVE
Glucose, UA: NEGATIVE mg/dL
Hgb urine dipstick: NEGATIVE
Ketones, ur: 20 mg/dL — AB
Nitrite: NEGATIVE
Protein, ur: NEGATIVE mg/dL
Specific Gravity, Urine: 1.017 (ref 1.005–1.030)
pH: 5 (ref 5.0–8.0)

## 2022-02-13 LAB — CBC WITH DIFFERENTIAL/PLATELET
Abs Immature Granulocytes: 0.02 10*3/uL (ref 0.00–0.07)
Basophils Absolute: 0 10*3/uL (ref 0.0–0.1)
Basophils Relative: 0 %
Eosinophils Absolute: 0.2 10*3/uL (ref 0.0–0.5)
Eosinophils Relative: 2 %
HCT: 29.6 % — ABNORMAL LOW (ref 36.0–46.0)
Hemoglobin: 8.8 g/dL — ABNORMAL LOW (ref 12.0–15.0)
Immature Granulocytes: 0 %
Lymphocytes Relative: 21 %
Lymphs Abs: 2.2 10*3/uL (ref 0.7–4.0)
MCH: 23 pg — ABNORMAL LOW (ref 26.0–34.0)
MCHC: 29.7 g/dL — ABNORMAL LOW (ref 30.0–36.0)
MCV: 77.5 fL — ABNORMAL LOW (ref 80.0–100.0)
Monocytes Absolute: 0.7 10*3/uL (ref 0.1–1.0)
Monocytes Relative: 7 %
Neutro Abs: 7.4 10*3/uL (ref 1.7–7.7)
Neutrophils Relative %: 70 %
Platelets: 324 10*3/uL (ref 150–400)
RBC: 3.82 MIL/uL — ABNORMAL LOW (ref 3.87–5.11)
RDW: 17.9 % — ABNORMAL HIGH (ref 11.5–15.5)
WBC: 10.5 10*3/uL (ref 4.0–10.5)
nRBC: 0 % (ref 0.0–0.2)

## 2022-02-13 LAB — POC URINE PREG, ED: Preg Test, Ur: NEGATIVE

## 2022-02-13 MED ORDER — NITROFURANTOIN MONOHYD MACRO 100 MG PO CAPS: 100.0000 mg | ORAL_CAPSULE | Freq: Two times a day (BID) | ORAL | 0 refills | Status: AC

## 2022-02-13 NOTE — ED Triage Notes (Signed)
Pt reports rash on right lower leg for several days. Pt reports area hurts when she touches it but does not itch.   Pt also reports UTI sx;'s for 1 week. Pt c/o urinary urgency, frequency and some dysuria.

## 2022-02-13 NOTE — ED Provider Notes (Signed)
[facility] Provider Note    Event Date/Time   First MD Initiated Contact with Patient 02/13/22 [phone]     (approximate)   History   Rash, Urinary Frequency, and Dysuria   HPI  Maria Rush is a 47 y.o. female with a past medical history of recurrent urinary tract infections who presents today for evaluation of rash on her right lower extremity for the past few days as well as urinary symptoms for the past week.  She describes her symptoms as urgency, frequency, and dysuria.  She also notes that her posterior right calf has developed a rash.  She notes that she is up to the electric blanket and is wondering if she burned herself.  However she is concerned about blood clot because multiple family members have had blood clots in her legs.  She has not noted significant swelling.  No chest pain or shortness of breath.  Patient Active Problem List   Diagnosis Date Noted   Interstitial cystitis 08/23/2016   UTI symptoms 05/27/2016   Goiter 11/28/2014   Bladder pain 12/16/2013   Chronic interstitial cystitis 12/16/2013   Increased frequency of urination 12/16/2013   Nocturia 02/12/2013   Recurrent UTI 02/12/2013   Urinary urgency 02/12/2013          Physical Exam   Triage Vital Signs: ED Triage Vitals  Enc Vitals Group     BP 02/13/22 0905 135/88     Pulse Rate 02/13/22 0905 92     Resp 02/13/22 0905 18     Temp 02/13/22 0905 98.6 F (37 C)     Temp Source 02/13/22 0905 Oral     SpO2 02/13/22 0905 99 %     Weight 02/13/22 0856 160 lb 15 oz (73 kg)     Height 02/13/22 0856 4\' 11"  (1.499 m)     Head Circumference --      Peak Flow --      Pain Score 02/13/22 0856 0     Pain Loc --      Pain Edu? --      Excl. in [place]? --     Most recent vital signs: Vitals:   02/13/22 0905  BP: 135/88  Pulse: 92  Resp: 18  Temp: 98.6 F (37 C)  SpO2: 99%    Physical Exam Vitals and nursing note reviewed.  Constitutional:      General: Awake and  alert. No acute distress.    Appearance: Normal appearance. The patient is normal weight.  HENT:     Head: Normocephalic and atraumatic.     Mouth: Mucous membranes are moist.  Eyes:     General: PERRL. Normal EOMs        Right eye: No discharge.        Left eye: No discharge.     Conjunctiva/sclera: Conjunctivae normal.  Cardiovascular:     Rate and Rhythm: Normal rate and regular rhythm.     Pulses: Normal pulses.     Heart sounds: Normal heart sounds Pulmonary:     Effort: Pulmonary effort is normal. No respiratory distress.     Breath sounds: Normal breath sounds.  Abdominal:     Abdomen is soft. There is no abdominal tenderness. No rebound or guarding. No distention. Musculoskeletal:        General: No swelling. Normal range of motion.     Cervical back: Normal range of motion and neck supple.  Skin:    General: Skin is warm  and dry.     Capillary Refill: Capillary refill takes less than 2 seconds.     Findings: Right posterior calf with 3 x 3 cm area of red blanching spots with a 3 x 3 mm blister, flat when not fluid-filled.  No skin sloughing.  Negative Nikolsky sign Neurological:     Mental Status: The patient is awake and alert.      ED Results / Procedures / Treatments   Labs (all labs ordered are listed, but only abnormal results are displayed) Labs Reviewed  URINALYSIS, ROUTINE W REFLEX MICROSCOPIC - Abnormal; Notable for the following components:      Result Value   Color, Urine YELLOW (*)    APPearance HAZY (*)    Ketones, ur 20 (*)    Leukocytes,Ua TRACE (*)    Bacteria, UA RARE (*)    All other components within normal limits  CBC WITH DIFFERENTIAL/PLATELET - Abnormal; Notable for the following components:   RBC 3.82 (*)    Hemoglobin 8.8 (*)    HCT 29.6 (*)    MCV 77.5 (*)    MCH 23.0 (*)    MCHC 29.7 (*)    RDW 17.9 (*)    All other components within normal limits  POC URINE PREG, ED     EKG     RADIOLOGY I independently reviewed and  interpreted imaging and agree with radiologists findings.     PROCEDURES:  Critical Care performed:   Procedures   MEDICATIONS ORDERED IN ED: Medications - No data to display   IMPRESSION / MDM / ASSESSMENT AND PLAN / ED COURSE  I reviewed the triage vital signs and the nursing notes.   Differential diagnosis includes, but is not limited to, DVT, calf strain, accidental burn, UTI, pyelonephritis.  Patient is awake and alert.  Hemodynamically stable and afebrile.  I reviewed the patient's chart.  She has had multiple UTIs in the past, most recent culture reveals many different bacteria.  I suspect that her symptoms are most likely due to contaminant today.  However, she is symptomatic with dysuria, urgency, and frequency so we will treat.  Ultrasound obtained of her lower extremity given her calf pain and skin changes in the setting of family history of DVT.  This was negative for DVT.  She reports that she sleeps with the electric blanket and thinks that she may have burned herself which is a possibility.  Her platelets are normal.  Recommended close outpatient follow-up and return to the emergency department if that changes in any way.  We discussed return precautions.  Patient understands and agrees with plan.  She was discharged in stable condition.   Patient's presentation is most consistent with acute complicated illness / injury requiring diagnostic workup.      FINAL CLINICAL IMPRESSION(S) / ED DIAGNOSES   Final diagnoses:  Rash and nonspecific skin eruption  Cystitis     Rx / DC Orders   ED Discharge Orders          Ordered    nitrofurantoin, macrocrystal-monohydrate, (MACROBID) 100 MG capsule  2 times daily        02/13/22 1128             Note:  This document was prepared using Dragon voice recognition software and may include unintentional dictation errors.   Keturah Shavers 02/13/22 1151    Corena Herter, MD 02/13/22 1520

## 2022-02-13 NOTE — Discharge Instructions (Signed)
You may take the antibiotic for your urinary tract infection.  Your ultrasound is negative for blood clot.  Please return for any new, worsening, or change in symptoms or other concerns.  It was a pleasure caring for you today.

## 2022-07-17 IMAGING — CR DG ABDOMEN 1V
2 series · 2 of 2 positions shown · non-contrast
Comparison: None.

CLINICAL DATA: Constipation.

EXAM:
ABDOMEN - 1 VIEW

[abdomen kub (1 of 2)]
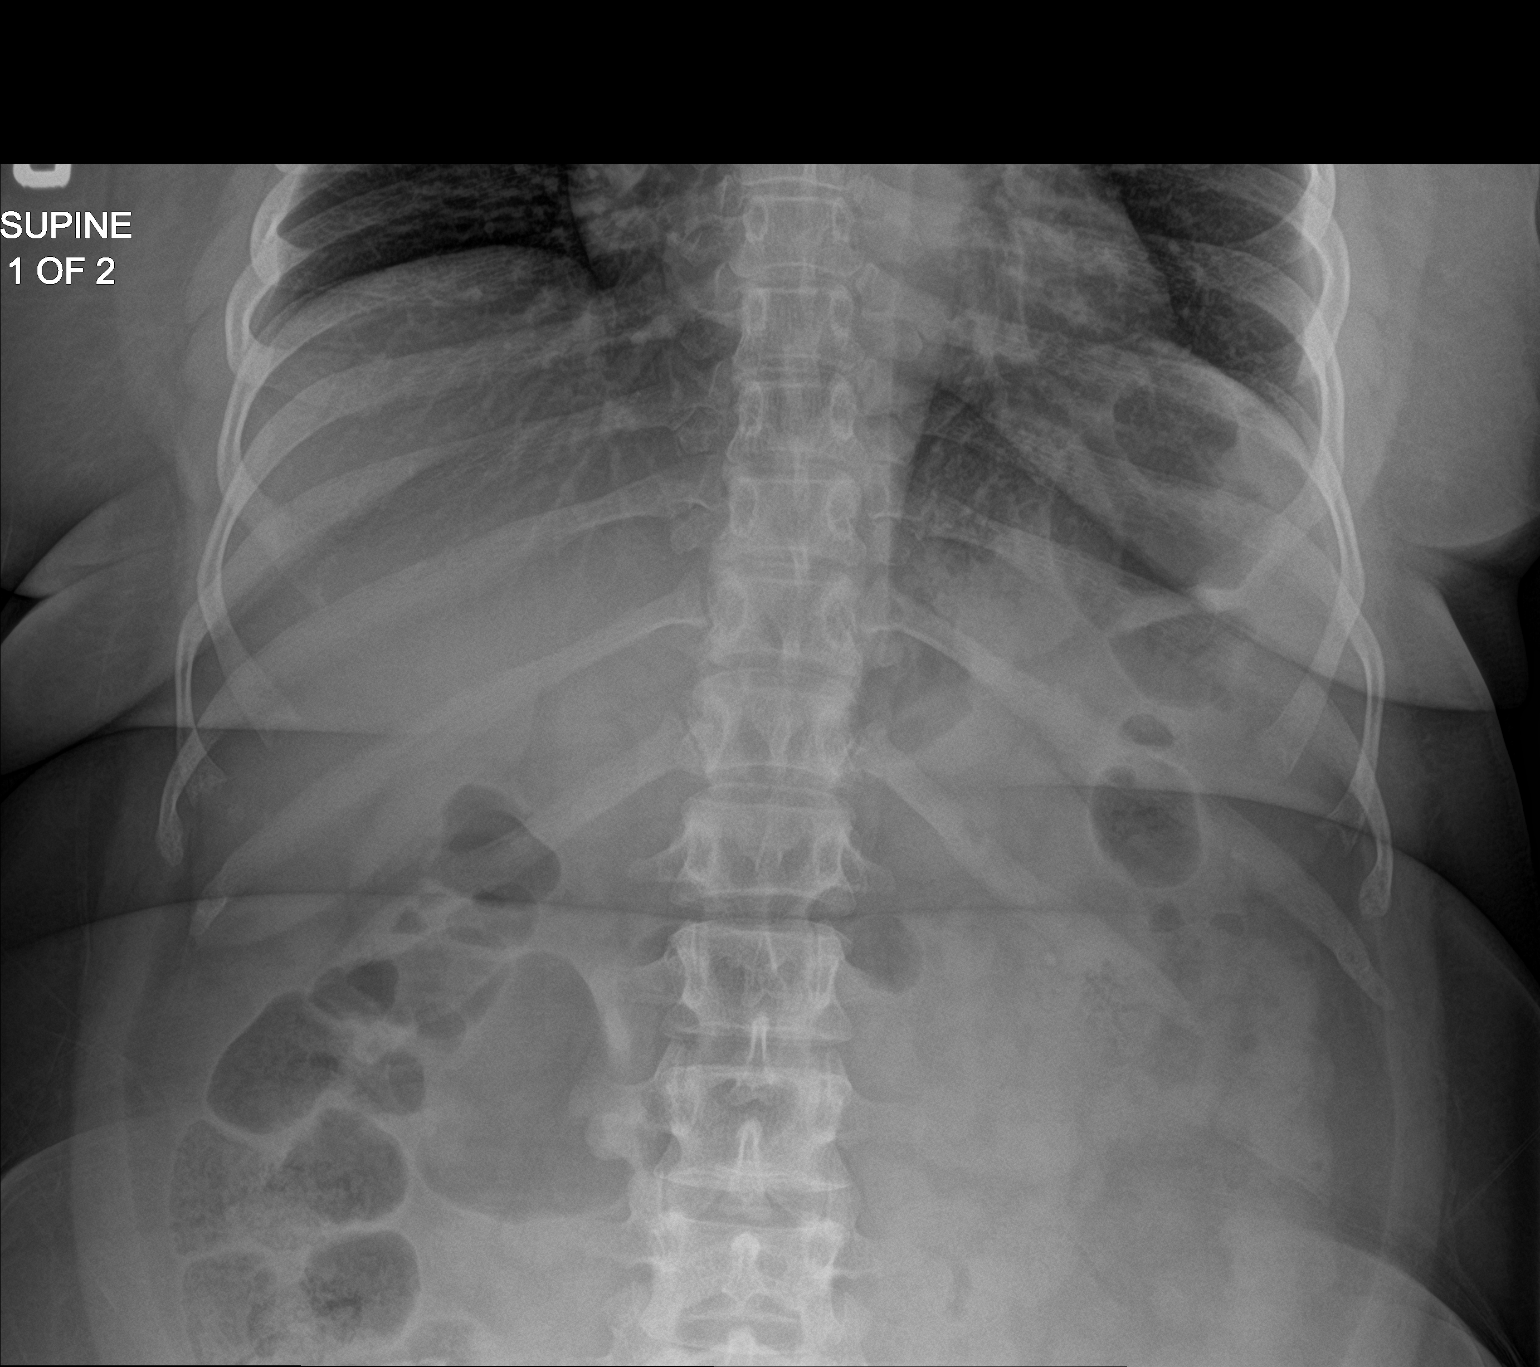

[abdomen kub (2 of 2)]
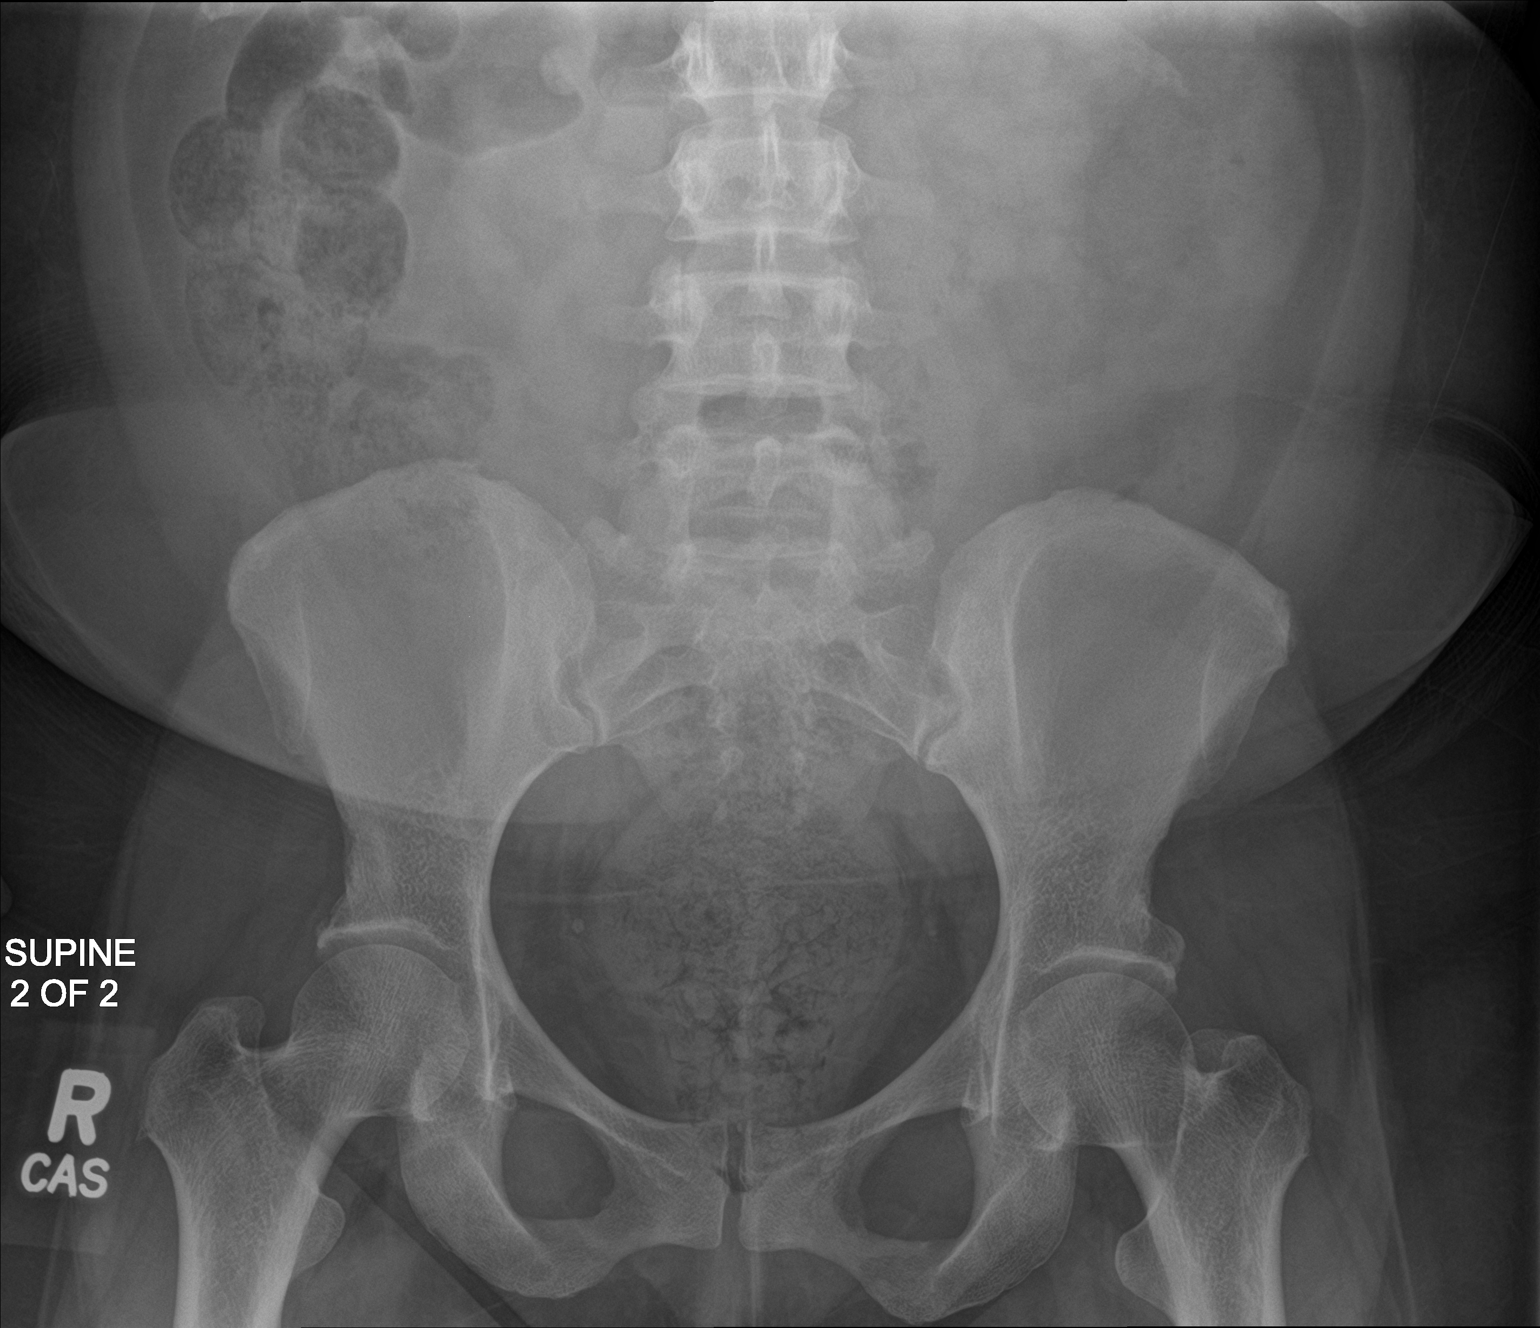

[2 of 2 positions shown; findings below may reference images not displayed]

FINDINGS: Lung bases are normal. A rounded density projects over the left
kidney, suspicious for a 4 mm stone. The right kidney is largely
obscured by bowel contents. A calcification in the right pelvis is
probably a phlebolith. A stool ball is seen in the region of the
rectum measuring 10 by 8 cm. The bowel gas pattern is otherwise
unremarkable. Mild fecal loading in the ascending colon.
IMPRESSION: 1. Prominent 10 x 8 cm stool ball in the rectum. Mild fecal loading
in the ascending colon.
2. Probable 4 mm stone in the left kidney.

## 2022-08-06 IMAGING — CR DG ABDOMEN 1V
1 series · 1 of 1 positions shown · non-contrast
Comparison: 11/24/2019 abdominal radiographs

CLINICAL DATA: Constipation for 3 weeks

EXAM:
ABDOMEN - 1 VIEW

[abdomen kub]
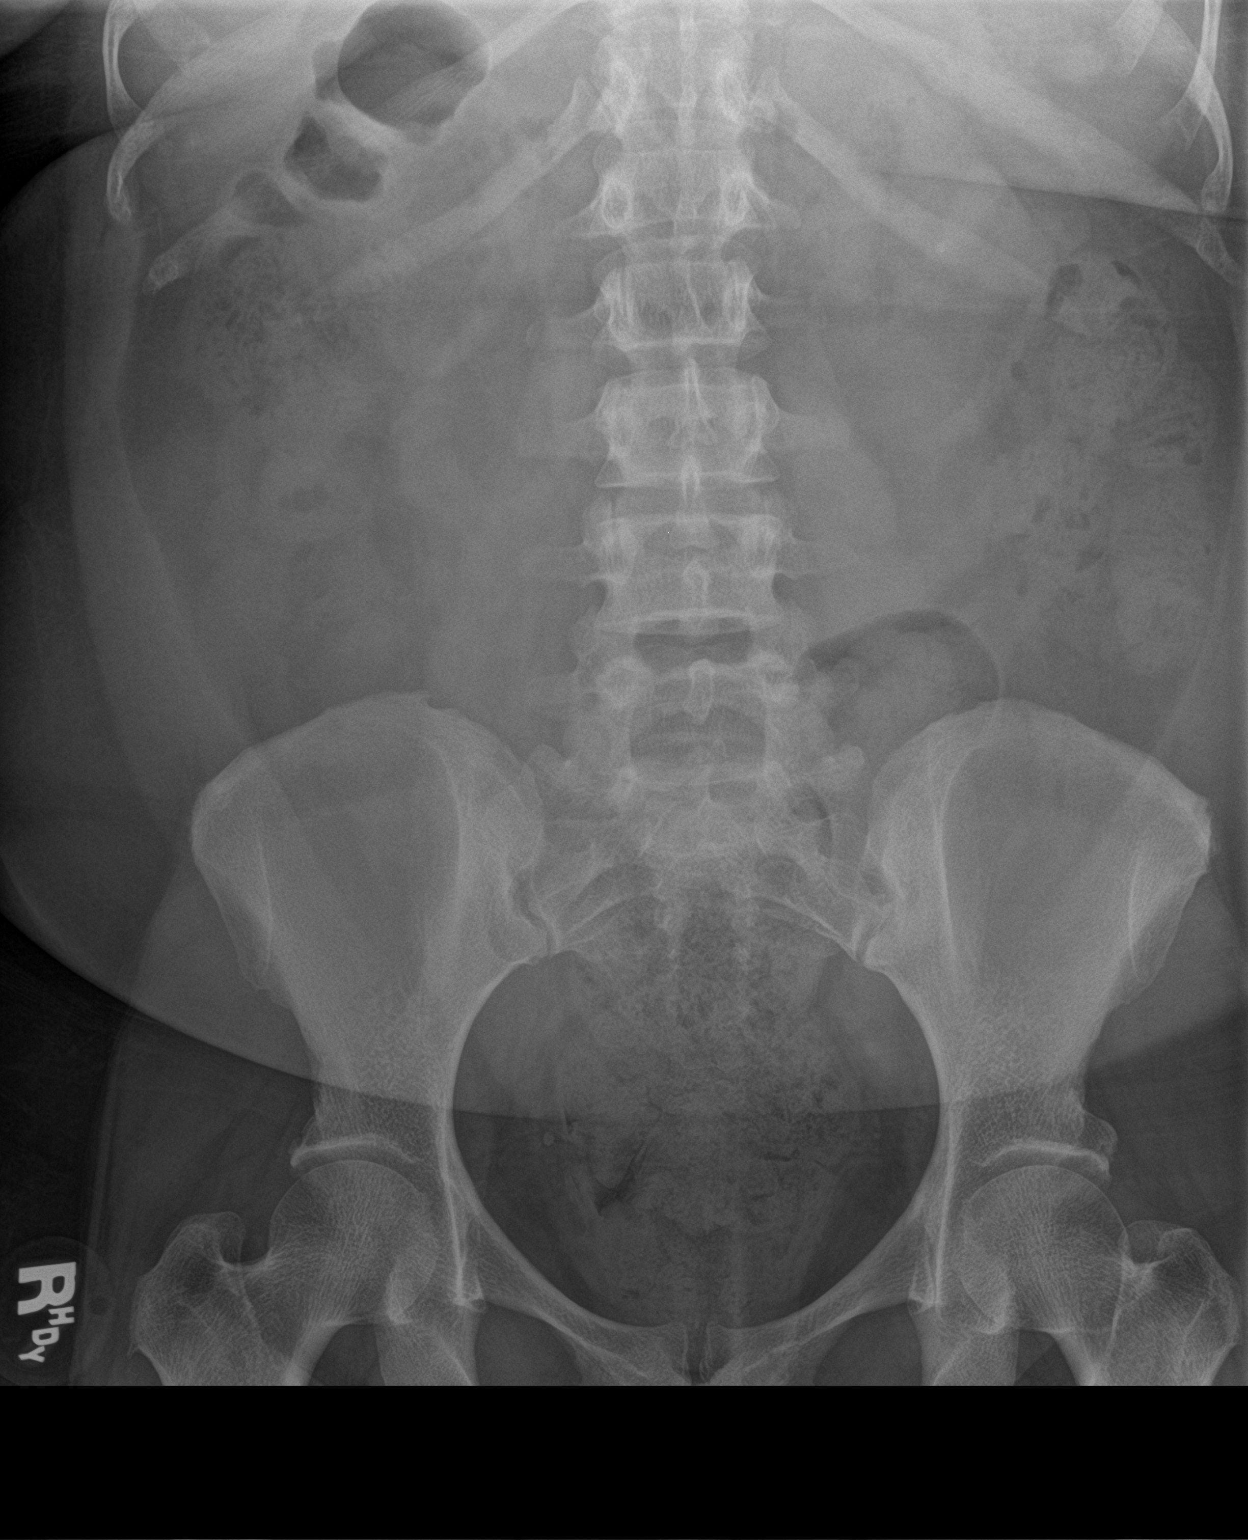

[1 of 1 positions shown; findings below may reference images not displayed]

FINDINGS: No dilated small bowel loops. Large colorectal stool volume, most
prominent in the rectum. No evidence of pneumatosis or
pneumoperitoneum. No radiopaque nephrolithiasis.
IMPRESSION: Large colorectal stool volume, most prominent in the rectum,
compatible with reported constipation. No evidence of bowel
obstruction.

## 2022-08-19 ENCOUNTER — Other Ambulatory Visit: Payer: Self-pay | Admitting: Otolaryngology

## 2022-08-19 DIAGNOSIS — E041 Nontoxic single thyroid nodule: Secondary | ICD-10-CM
